# Patient Record
Sex: Male | Born: 1950 | Race: White | Hispanic: No | Marital: Married | Smoking: Never smoker
Health system: Southern US, Community
[De-identification: ages and names within clinical notes are randomized; demographics above are authoritative.]

## PROBLEM LIST (undated history)

## (undated) DIAGNOSIS — I10 Essential (primary) hypertension: Secondary | ICD-10-CM

## (undated) DIAGNOSIS — R972 Elevated prostate specific antigen [PSA]: Secondary | ICD-10-CM

## (undated) DIAGNOSIS — C801 Malignant (primary) neoplasm, unspecified: Secondary | ICD-10-CM

## (undated) DIAGNOSIS — M1990 Unspecified osteoarthritis, unspecified site: Secondary | ICD-10-CM

## (undated) DIAGNOSIS — Z973 Presence of spectacles and contact lenses: Secondary | ICD-10-CM

---

## 1976-07-01 HISTORY — PX: ANAL FISSURE REPAIR: SHX2312

## 2001-11-05 ENCOUNTER — Ambulatory Visit (HOSPITAL_BASED_OUTPATIENT_CLINIC_OR_DEPARTMENT_OTHER): Admission: RE | Admit: 2001-11-05 | Discharge: 2001-11-05 | Payer: Self-pay | Admitting: Orthopedic Surgery

## 2003-06-17 ENCOUNTER — Ambulatory Visit (HOSPITAL_COMMUNITY): Admission: RE | Admit: 2003-06-17 | Discharge: 2003-06-17 | Payer: Self-pay | Admitting: Gastroenterology

## 2012-01-23 ENCOUNTER — Other Ambulatory Visit: Payer: Self-pay | Admitting: Physician Assistant

## 2012-02-04 ENCOUNTER — Other Ambulatory Visit: Payer: Self-pay | Admitting: Dermatology

## 2016-05-01 DIAGNOSIS — L570 Actinic keratosis: Secondary | ICD-10-CM | POA: Diagnosis not present

## 2016-05-01 DIAGNOSIS — Z85828 Personal history of other malignant neoplasm of skin: Secondary | ICD-10-CM | POA: Diagnosis not present

## 2016-05-01 DIAGNOSIS — D225 Melanocytic nevi of trunk: Secondary | ICD-10-CM | POA: Diagnosis not present

## 2016-05-01 DIAGNOSIS — D2271 Melanocytic nevi of right lower limb, including hip: Secondary | ICD-10-CM | POA: Diagnosis not present

## 2016-05-01 DIAGNOSIS — D485 Neoplasm of uncertain behavior of skin: Secondary | ICD-10-CM | POA: Diagnosis not present

## 2016-05-01 DIAGNOSIS — B351 Tinea unguium: Secondary | ICD-10-CM | POA: Diagnosis not present

## 2016-05-01 DIAGNOSIS — L821 Other seborrheic keratosis: Secondary | ICD-10-CM | POA: Diagnosis not present

## 2016-05-01 DIAGNOSIS — D2261 Melanocytic nevi of right upper limb, including shoulder: Secondary | ICD-10-CM | POA: Diagnosis not present

## 2016-05-01 DIAGNOSIS — D2272 Melanocytic nevi of left lower limb, including hip: Secondary | ICD-10-CM | POA: Diagnosis not present

## 2016-05-01 DIAGNOSIS — L814 Other melanin hyperpigmentation: Secondary | ICD-10-CM | POA: Diagnosis not present

## 2016-05-07 DIAGNOSIS — R350 Frequency of micturition: Secondary | ICD-10-CM | POA: Diagnosis not present

## 2016-05-07 DIAGNOSIS — R972 Elevated prostate specific antigen [PSA]: Secondary | ICD-10-CM | POA: Diagnosis not present

## 2016-05-07 DIAGNOSIS — N5201 Erectile dysfunction due to arterial insufficiency: Secondary | ICD-10-CM | POA: Diagnosis not present

## 2016-05-07 DIAGNOSIS — N401 Enlarged prostate with lower urinary tract symptoms: Secondary | ICD-10-CM | POA: Diagnosis not present

## 2016-05-15 ENCOUNTER — Other Ambulatory Visit: Payer: Self-pay | Admitting: Urology

## 2016-05-15 DIAGNOSIS — R972 Elevated prostate specific antigen [PSA]: Secondary | ICD-10-CM

## 2016-06-04 ENCOUNTER — Ambulatory Visit
Admission: RE | Admit: 2016-06-04 | Discharge: 2016-06-04 | Disposition: A | Discharge status: Home / Self Care | Payer: Medicare Other | Source: Ambulatory Visit | Attending: Urology | Admitting: Urology

## 2016-06-04 DIAGNOSIS — N400 Enlarged prostate without lower urinary tract symptoms: Secondary | ICD-10-CM | POA: Diagnosis not present

## 2016-06-04 DIAGNOSIS — R972 Elevated prostate specific antigen [PSA]: Secondary | ICD-10-CM

## 2016-06-04 IMAGING — MR MR PROSTATE WO/W CM
56 series · 56 of 56 positions shown · IV contrast (Multihance 18ml)
Comparison: None.

CLINICAL DATA: Enlarged prostate, rising PSA (now 7.0), prior
benign biopsy in [XR]

EXAM:
MR PROSTATE WITHOUT AND WITH CONTRAST
TECHNIQUE: Multiplanar multisequence MRI images were obtained of the pelvis
centered about the prostate. Pre and post contrast images were
obtained.
CONTRAST:  18mL MULTIHANCE GADOBENATE DIMEGLUMINE 529 MG/ML IV SOLN

[Series 3: T1 · axial · 8.0mm · 1.06mm/px · 1 of 24 slices shown (1 of 2)]
[im 1/24]
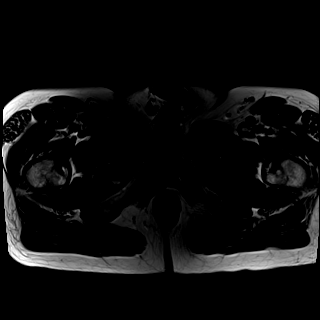

[Series 4: bSSFP fat-sat · axial · 8.0mm · 0.74mm/px · 1 of 24 slices shown]
[im 1/24]
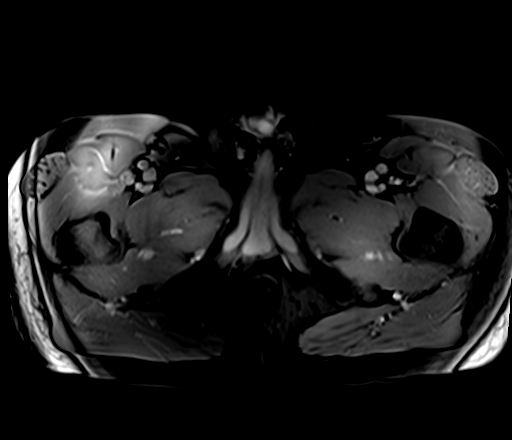

[Series 5: T2 · sagittal · 3.5mm · 0.56mm/px · 1 of 39 slices shown (1 of 4)]
[im 1/39]
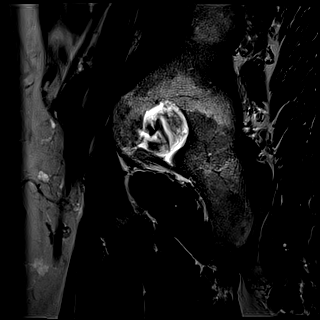

[Series 6: T1 · axial · 3.0mm · 0.31mm/px · 1 of 24 slices shown (2 of 2)]
[im 1/24]
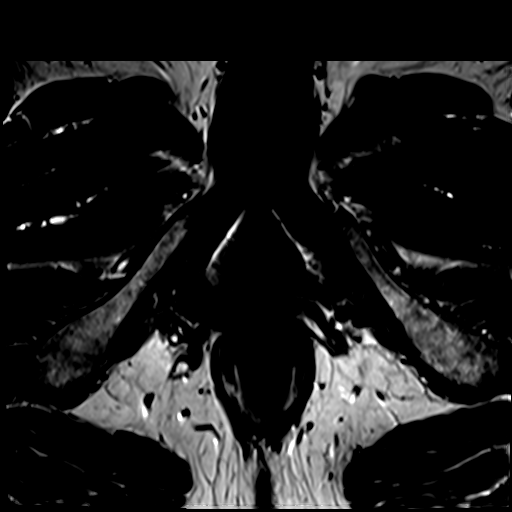

[Series 7: T2 · axial · 3.5mm · 0.56mm/px · 1 of 20 slices shown (2 of 4)]
[im 1/20]
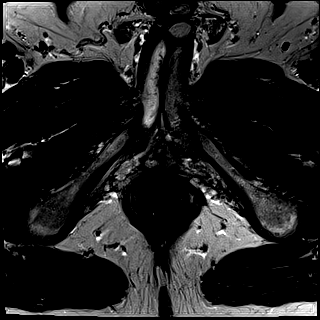

[Series 8: T2 · axial · 1.0mm · 1.04mm/px · 1 of 80 slices shown (3 of 4)]
[im 1/80]
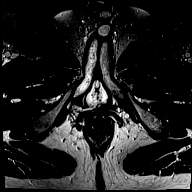

[Series 9: T2 · coronal · 3.5mm · 0.56mm/px · 1 of 20 slices shown (4 of 4)]
[im 1/20]
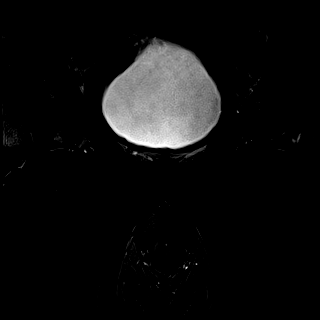

[Series 10: DWI · axial · 3.5mm · 1.56mm/px · 1 of 60 slices shown (1 of 2)]
[im 1/60]
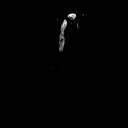

[Series 11: DWI · axial · 3.5mm · 1.56mm/px · 1 of 20 slices shown (2 of 2)]
[im 1/20]
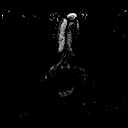

[Series 12: t1_twist_tra_dyn_ttc=5.3s · axial · 3.5mm · 0.83mm/px · 1 of 20 slices shown]
[im 1/20]
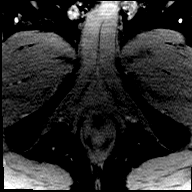

[Series 13: t1_twist_tra_dyn-copy center to · axial · 3.5mm · 0.83mm/px · 1 of 20 slices shown (1 of 24)]
[im 1/20]
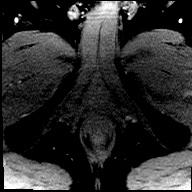

[Series 14: t1_twist_tra_dyn-copy center to · axial · 3.5mm · 0.83mm/px · 1 of 20 slices shown (2 of 24)]
[im 1/20]
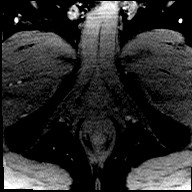

[Series 15: t1_twist_tra_dyn-copy center to_sub_ttc=23.0s · axial · 3.5mm · 0.83mm/px · 1 of 16 slices shown]
[im 1/16]
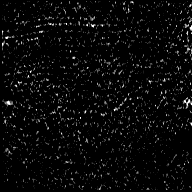

[Series 16: t1_twist_tra_dyn-copy center to · axial · 3.5mm · 0.83mm/px · 1 of 20 slices shown (3 of 24)]
[im 1/20]
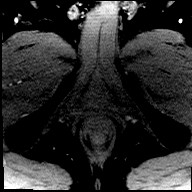

[Series 17: t1_twist_tra_dyn-copy center to_sub_ttc=33.2s · axial · 3.5mm · 0.83mm/px · 1 of 20 slices shown]
[im 1/20]
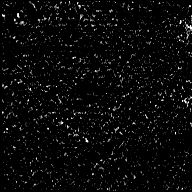

[Series 18: t1_twist_tra_dyn-copy center to · axial · 3.5mm · 0.83mm/px · 1 of 20 slices shown (4 of 24)]
[im 1/20]
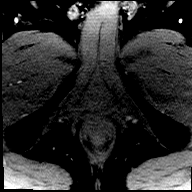

[Series 19: t1_twist_tra_dyn-copy center to_sub_ttc=43.4s · axial · 3.5mm · 0.83mm/px · 1 of 19 slices shown]
[im 1/19]
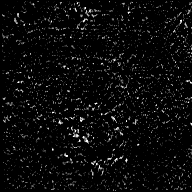

[Series 20: t1_twist_tra_dyn-copy center to · axial · 3.5mm · 0.83mm/px · 1 of 20 slices shown (5 of 24)]
[im 1/20]
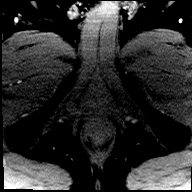

[Series 21: t1_twist_tra_dyn-copy center to_sub_ttc=53.6s · axial · 3.5mm · 0.83mm/px · 1 of 20 slices shown]
[im 1/20]
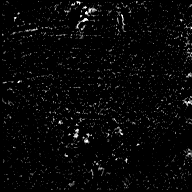

[Series 22: t1_twist_tra_dyn-copy center to · axial · 3.5mm · 0.83mm/px · 1 of 20 slices shown (6 of 24)]
[im 1/20]
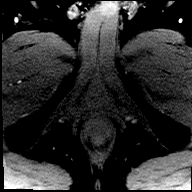

[Series 23: t1_twist_tra_dyn-copy center to_sub_ttc=63.8s · axial · 3.5mm · 0.83mm/px · 1 of 20 slices shown]
[im 1/20]
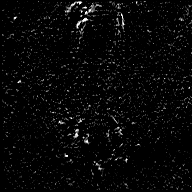

[Series 24: t1_twist_tra_dyn-copy center to · axial · 3.5mm · 0.83mm/px · 1 of 20 slices shown (7 of 24)]
[im 1/20]
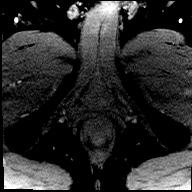

[Series 25: t1_twist_tra_dyn-copy center to_sub_ttc=74.0s · axial · 3.5mm · 0.83mm/px · 1 of 20 slices shown]
[im 1/20]
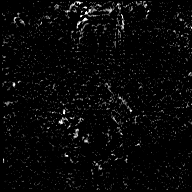

[Series 26: t1_twist_tra_dyn-copy center to · axial · 3.5mm · 0.83mm/px · 1 of 20 slices shown (8 of 24)]
[im 1/20]
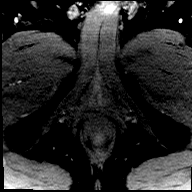

[Series 27: t1_twist_tra_dyn-copy center to_sub_ttc=84.2s · axial · 3.5mm · 0.83mm/px · 1 of 20 slices shown]
[im 1/20]
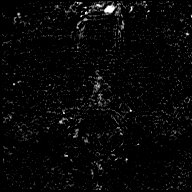

[Series 28: t1_twist_tra_dyn-copy center to · axial · 3.5mm · 0.83mm/px · 1 of 20 slices shown (9 of 24)]
[im 1/20]
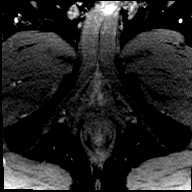

[Series 29: t1_twist_tra_dyn-copy center to_sub_ttc=94.4s · axial · 3.5mm · 0.83mm/px · 1 of 20 slices shown]
[im 1/20]
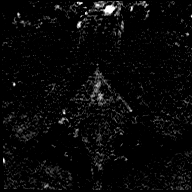

[Series 30: t1_twist_tra_dyn-copy center to · axial · 3.5mm · 0.83mm/px · 1 of 20 slices shown (10 of 24)]
[im 1/20]
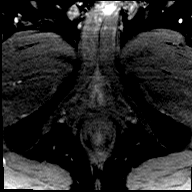

[Series 31: t1_twist_tra_dyn-copy center to_sub_ttc=104.7s · axial · 3.5mm · 0.83mm/px · 1 of 20 slices shown]
[im 1/20]
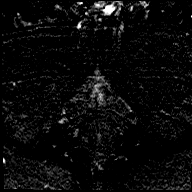

[Series 32: t1_twist_tra_dyn-copy center to · axial · 3.5mm · 0.83mm/px · 1 of 20 slices shown (11 of 24)]
[im 1/20]
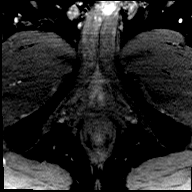

[Series 33: t1_twist_tra_dyn-copy center to_sub_ttc=114.9s · axial · 3.5mm · 0.83mm/px · 1 of 20 slices shown]
[im 1/20]
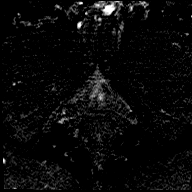

[Series 34: t1_twist_tra_dyn-copy center to · axial · 3.5mm · 0.83mm/px · 1 of 20 slices shown (12 of 24)]
[im 1/20]
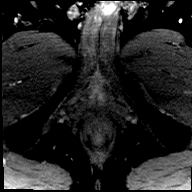

[Series 35: t1_twist_tra_dyn-copy center to_sub_ttc=125.1s · axial · 3.5mm · 0.83mm/px · 1 of 20 slices shown]
[im 1/20]
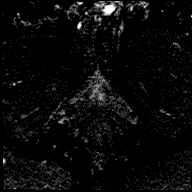

[Series 36: t1_twist_tra_dyn-copy center to · axial · 3.5mm · 0.83mm/px · 1 of 20 slices shown (13 of 24)]
[im 1/20]
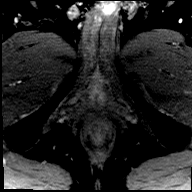

[Series 37: t1_twist_tra_dyn-copy center to_sub_ttc=135.3s · axial · 3.5mm · 0.83mm/px · 1 of 20 slices shown]
[im 1/20]
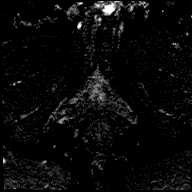

[Series 38: t1_twist_tra_dyn-copy center to · axial · 3.5mm · 0.83mm/px · 1 of 20 slices shown (14 of 24)]
[im 1/20]
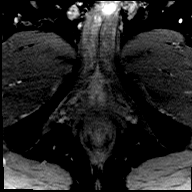

[Series 39: t1_twist_tra_dyn-copy center to_sub_ttc=145.5s · axial · 3.5mm · 0.83mm/px · 1 of 20 slices shown]
[im 1/20]
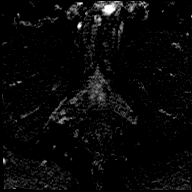

[Series 40: t1_twist_tra_dyn-copy center to · axial · 3.5mm · 0.83mm/px · 1 of 20 slices shown (15 of 24)]
[im 1/20]
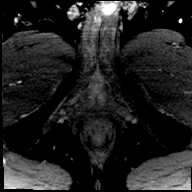

[Series 41: t1_twist_tra_dyn-copy center to_sub_ttc=155.7s · axial · 3.5mm · 0.83mm/px · 1 of 20 slices shown]
[im 1/20]
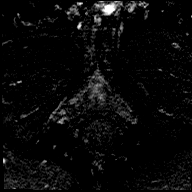

[Series 42: t1_twist_tra_dyn-copy center to · axial · 3.5mm · 0.83mm/px · 1 of 20 slices shown (16 of 24)]
[im 1/20]
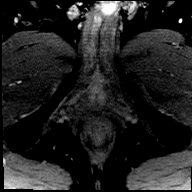

[Series 43: t1_twist_tra_dyn-copy center to_sub_ttc=165.9s · axial · 3.5mm · 0.83mm/px · 1 of 20 slices shown]
[im 1/20]
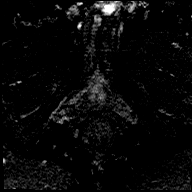

[Series 44: t1_twist_tra_dyn-copy center to · axial · 3.5mm · 0.83mm/px · 1 of 20 slices shown (17 of 24)]
[im 1/20]
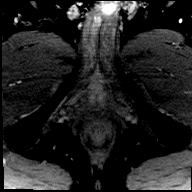

[Series 45: t1_twist_tra_dyn-copy center to_sub_ttc=176.1s · axial · 3.5mm · 0.83mm/px · 1 of 20 slices shown]
[im 1/20]
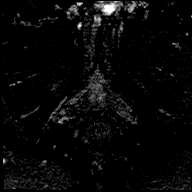

[Series 46: t1_twist_tra_dyn-copy center to · axial · 3.5mm · 0.83mm/px · 1 of 20 slices shown (18 of 24)]
[im 1/20]
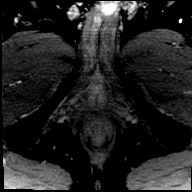

[Series 47: t1_twist_tra_dyn-copy center to_sub_ttc=186.3s · axial · 3.5mm · 0.83mm/px · 1 of 20 slices shown]
[im 1/20]
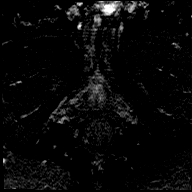

[Series 48: t1_twist_tra_dyn-copy center to · axial · 3.5mm · 0.83mm/px · 1 of 20 slices shown (19 of 24)]
[im 1/20]
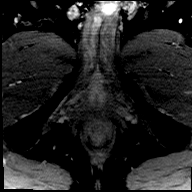

[Series 49: t1_twist_tra_dyn-copy center to_sub_ttc=196.5s · axial · 3.5mm · 0.83mm/px · 1 of 20 slices shown]
[im 1/20]
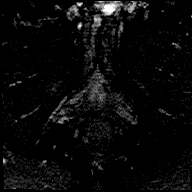

[Series 50: t1_twist_tra_dyn-copy center to · axial · 3.5mm · 0.83mm/px · 1 of 20 slices shown (20 of 24)]
[im 1/20]
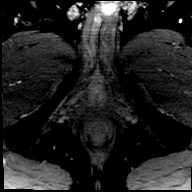

[Series 51: t1_twist_tra_dyn-copy center to_sub_ttc=206.7s · axial · 3.5mm · 0.83mm/px · 1 of 20 slices shown]
[im 1/20]
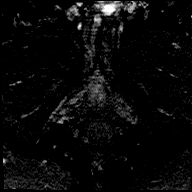

[Series 52: t1_twist_tra_dyn-copy center to · axial · 3.5mm · 0.83mm/px · 1 of 20 slices shown (21 of 24)]
[im 1/20]
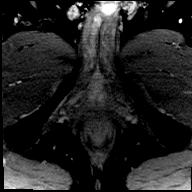

[Series 53: t1_twist_tra_dyn-copy center to_sub_ttc=216.9s · axial · 3.5mm · 0.83mm/px · 1 of 20 slices shown]
[im 1/20]
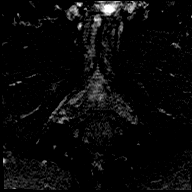

[Series 54: t1_twist_tra_dyn-copy center to · axial · 3.5mm · 0.83mm/px · 1 of 20 slices shown (22 of 24)]
[im 1/20]
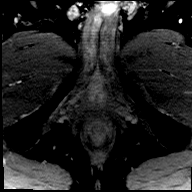

[Series 55: t1_twist_tra_dyn-copy center to_sub_ttc=227.1s · axial · 3.5mm · 0.83mm/px · 1 of 20 slices shown]
[im 1/20]
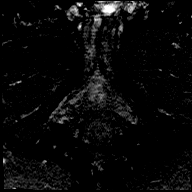

[Series 56: t1_twist_tra_dyn-copy center to · axial · 3.5mm · 0.83mm/px · 1 of 20 slices shown (23 of 24)]
[im 1/20]
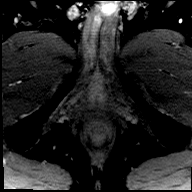

[Series 57: t1_twist_tra_dyn-copy center to_sub_ttc=237.3s · axial · 3.5mm · 0.83mm/px · 1 of 20 slices shown]
[im 1/20]
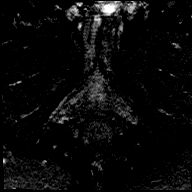

[Series 58: t1_twist_tra_dyn-copy center to · axial · 3.5mm · 0.83mm/px · 1 of 20 slices shown (24 of 24)]
[im 1/20]
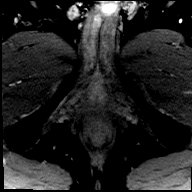

[56 of 56 positions shown; findings below may reference images not displayed]

FINDINGS: Gas within the rectum causes susceptibility artifact on
diffusion/ADC imaging, impairing characterization for restricted
diffusion.

Prostate: 2.1 x 0.9 cm low T2 lesion in the lateral right mid
peripheral gland (series 7/image 11). No definite early arterial
enhancement. Although there is no definite low ADC, this may be
related to the artifact. Technically speaking, the inability to
accurately gauge restricted diffusion inhibits PIRADS
characterization, but this likely reflects a PIRADS 3 lesion based
on T2 appearance.

Mild enlargement/nodularity of the central gland.

Transcapsular spread:  No definite extracapsular extension.

Seminal vesicle involvement: Absent.

Neurovascular bundle involvement: Absent.

Pelvic adenopathy: Absent.

Bone metastasis: Absent.

Other findings: None.
IMPRESSION: Limited evaluation due to artifact, as described above.

2.1 x 0.9 cm low T2 lesion the lateral right mid peripheral gland,
likely reflecting at least a PIRADS 3 lesion.

No evidence of extracapsular extension, seminal vesicle invasion, or
metastatic disease.

## 2016-06-04 MED ORDER — GADOBENATE DIMEGLUMINE 529 MG/ML IV SOLN
18.0000 mL | Freq: Once | INTRAVENOUS | Status: AC | PRN
  Administered 2016-06-04: 18 mL via INTRAVENOUS

## 2016-06-12 DIAGNOSIS — R350 Frequency of micturition: Secondary | ICD-10-CM | POA: Diagnosis not present

## 2016-06-12 DIAGNOSIS — N401 Enlarged prostate with lower urinary tract symptoms: Secondary | ICD-10-CM | POA: Diagnosis not present

## 2016-06-12 DIAGNOSIS — R351 Nocturia: Secondary | ICD-10-CM | POA: Diagnosis not present

## 2016-06-12 DIAGNOSIS — R972 Elevated prostate specific antigen [PSA]: Secondary | ICD-10-CM | POA: Diagnosis not present

## 2016-06-12 DIAGNOSIS — N5201 Erectile dysfunction due to arterial insufficiency: Secondary | ICD-10-CM | POA: Diagnosis not present

## 2016-07-30 ENCOUNTER — Other Ambulatory Visit (HOSPITAL_COMMUNITY): Payer: Self-pay | Admitting: Urology

## 2016-07-30 ENCOUNTER — Other Ambulatory Visit: Payer: Self-pay | Admitting: Urology

## 2016-07-30 DIAGNOSIS — R972 Elevated prostate specific antigen [PSA]: Secondary | ICD-10-CM

## 2016-08-05 ENCOUNTER — Ambulatory Visit (HOSPITAL_COMMUNITY): Payer: Medicare Other

## 2016-08-12 ENCOUNTER — Other Ambulatory Visit: Payer: Self-pay | Admitting: Urology

## 2016-08-15 ENCOUNTER — Encounter (HOSPITAL_BASED_OUTPATIENT_CLINIC_OR_DEPARTMENT_OTHER): Payer: Self-pay | Admitting: *Deleted

## 2016-08-15 NOTE — Progress Notes (Signed)
NPO AFTER MN.  ARRIVE AT 0600.  NEEDS ISTAT 8  AND EKG.  WILL DO FLEET ENEMA AS INSTRUCTED BY OFFICE.

## 2016-08-21 NOTE — H&P (Signed)
Office Visit Report     06/12/2016   --------------------------------------------------------------------------------   Jim Mcknight  MRN: [id number]  PRIMARY CARE:  Cleda Mccreedy  DOB: Sep 07, 1950, 66 year old Male  REFERRING:  Jep Dyas I. Gaynelle Arabian, MD  SSN:  PROVIDER:  Carolan Clines, M.D.    LOCATION:  [facility] [phone]   --------------------------------------------------------------------------------   CC: BPH  HPI: Jim Mcknight is a 66 year-old male established patient who is here for follow up regarding further evaluation of BPH and lower urinary tract symptoms.  The patient complains of lower urinary tract symptom(s) that include frequency and nocturia. His symptoms have been stable over the last year. The patient states if he were to spend the rest of his life with his current urinary condition, he would be pleased.     CC: Elevated PSA-Established Patient  HPI: His last PSA was performed 03/11/2016. The last PSA value was 8.55. The patient states he does not take 5 alpha reductase inhibitor medication.   He has had a prostate biopsy done. Patient does not have a family history of prostate cancer. The patient complains of lower urinary tract symptom(s) that include frequency and nocturia.   He has had a previous prostate biopsy on 02/07/10 which pathology was negative with a 28cc size prostate.   F/u to review MRI prostate.     CC: I have erectile dysfunction (Meds).  HPI: He is currently on Viagra for treatment of his erectile dysfunction.   He does not have difficulties achieving an erection. He does not have trouble maintaining his erection.     AUA Symptom Score: He never has the sensation of not emptying his bladder completely after finishing urinating. Less than 20% of the time he has to urinate again fewer than two hours after he has finished urinating. He does not have to stop and start again several times when he  urinates. He never finds it difficult to postpone urination. He never has a weak urinary stream. He never has to push or strain to begin urination. He has to get up to urinate 2 times from the time he goes to bed until the time he gets up in the morning.   Calculated AUA Symptom Score: 3    ALLERGIES: No Allergies    MEDICATIONS: Lisinopril  Viagra 50 mg tablet  Ecotrin Low Strength 81 MG Oral Tablet Delayed Release Oral  Fish Oil CAPS Oral  Folic Acid [id number] MCG Oral Tablet Oral  Metoprolol Tartrate 25 MG Oral Tablet Oral  Multivitamin  Vitamin C 500 MG Oral Tablet Oral  Vitamin D3 1000 UNIT Oral Capsule Oral     GU PSH: Vasectomy - 2011    NON-GU PSH: Diagnostic Colonoscopy - 2011 Remove Anal Fissure    GU PMH: BPH w/LUTS - 05/07/2016, Benign localized hyperplasia of prostate with urinary obstruction, - 2015 ED, arterial insufficiency - 05/07/2016 Elevated PSA - 05/07/2016, Elevated prostate specific antigen (PSA), - 2015    NON-GU PMH: Encounter for general adult medical examination without abnormal findings, Encounter for preventive health examination - 2015 Diverticulosis, Diverticulosis - 2014 Personal history of other diseases of the circulatory system, History of hypertension - 2014 Personal history of other endocrine, nutritional and metabolic disease, History of hypercholesterolemia - 2014    FAMILY HISTORY: Brain Cancer - Mother Breast Cancer - Mother Family Health Status Number - Runs In Family Father Deceased At [place] ___ - Runs In Family Heart Disease - Brother, Mother Lung Cancer -  Father Mother Deceased At Age 42 from diabetic complicati - Runs In Family   SOCIAL HISTORY: Marital Status: Married Current Smoking Status: Patient has never smoked.  Has never drank.  Drinks 2 caffeinated drinks per day. Patient's occupation Research officer, political party.    REVIEW OF SYSTEMS:    GU Review Male:   Patient reports get up at night to urinate. Patient denies  frequent urination, hard to postpone urination, burning/ pain with urination, leakage of urine, stream starts and stops, trouble starting your stream, have to strain to urinate , erection problems, and penile pain.  Gastrointestinal (Upper):   Patient denies nausea, vomiting, and indigestion/ heartburn.  Gastrointestinal (Lower):   Patient denies diarrhea and constipation.  Constitutional:   Patient denies fever, night sweats, weight loss, and fatigue.  Skin:   Patient denies skin rash/ lesion and itching.  Eyes:   Patient denies blurred vision and double vision.  Ears/ Nose/ Throat:   Patient denies sore throat and sinus problems.  Hematologic/Lymphatic:   Patient denies swollen glands and easy bruising.  Cardiovascular:   Patient denies leg swelling and chest pains.  Respiratory:   Patient denies cough and shortness of breath.  Endocrine:   Patient denies excessive thirst.  Musculoskeletal:   Patient denies back pain and joint pain.  Neurological:   Patient denies headaches and dizziness.  Psychologic:   Patient denies depression and anxiety.   VITAL SIGNS:      06/12/2016 09:09 AM  BP 132/80 mmHg  Pulse 67 /min  Temperature 97.6 F / 36 C   GU PHYSICAL EXAMINATION:    Anus and Perineum: No hemorrhoids. No anal stenosis. No rectal fissure, no anal fissure. No edema, no dimple, no perineal tenderness, no anal tenderness.  Scrotum: No lesions. No edema. No cysts. No warts.  Epididymides: Right: no spermatocele, no masses, no cysts, no tenderness, no induration, no enlargement. Left: no spermatocele, no masses, no cysts, no tenderness, no induration, no enlargement.  Testes: No tenderness, no swelling, no enlargement left testes. No tenderness, no swelling, no enlargement right testes. Normal location left testes. Normal location right testes. No mass, no cyst, no varicocele, no hydrocele left testes. No mass, no cyst, no varicocele, no hydrocele right testes.  Urethral Meatus: Normal size.  No lesion, no wart, no discharge, no polyp. Normal location.  Penis: Circumcised, no warts, no cracks. No dorsal Peyronie's plaques, no left corporal Peyronie's plaques, no right corporal Peyronie's plaques, no scarring, no warts. No balanitis, no meatal stenosis.  Prostate: 40 gram or 2+ size. Left lobe normal consistency, right lobe normal consistency. Symmetrical lobes. No prostate nodule. Left lobe no tenderness, right lobe no tenderness.  Seminal Vesicles: Nonpalpable.  Sphincter Tone: Normal sphincter. No rectal tenderness. No rectal mass.    MULTI-SYSTEM PHYSICAL EXAMINATION:    Constitutional: Well-nourished. No physical deformities. Normally developed. Good grooming.  Neck: Neck symmetrical, not swollen. Normal tracheal position.  Respiratory: No labored breathing, no use of accessory muscles.   Cardiovascular: Normal temperature, normal extremity pulses, no swelling, no varicosities.  Lymphatic: No enlargement of neck, axillae, groin.  Skin: No paleness, no jaundice, no cyanosis. No lesion, no ulcer, no rash.  Neurologic / Psychiatric: Oriented to time, oriented to place, oriented to person. No depression, no anxiety, no agitation.  Gastrointestinal: No mass, no tenderness, no rigidity, non obese abdomen.  Eyes: Normal conjunctivae. Normal eyelids.  Ears, Nose, Mouth, and Throat: Left ear no scars, no lesions, no masses. Right ear no scars, no lesions, no  masses. Nose no scars, no lesions, no masses. Normal hearing. Normal lips.  Musculoskeletal: Normal gait and station of head and neck.     PAST DATA REVIEWED:  Source Of History:  Patient  Records Review:   Previous Patient Records  X-Ray Review: MRI Prostate GSORAD: Reviewed Films. Reviewed Report. Discussed With Patient.     05/07/16 01/16/10  PSA  Total PSA 7.01 ng/dl 4.38   Free PSA 0.85 ng/dl 0.49   % Free PSA 12 % 11.2     PROCEDURES:          Urinalysis Dipstick Dipstick Cont'd  Color: Yellow Bilirubin: Neg   Appearance: Clear Ketones: Neg  Specific Gravity: 1.015 Blood: Neg  pH: 6.5 Protein: Neg  Glucose: Neg Urobilinogen: 0.2    Nitrites: Neg    Leukocyte Esterase: Neg    ASSESSMENT:      ICD-10 Details  1 GU:   BPH w/LUTS - N40.1   2   Elevated PSA - R97.20   3   ED, arterial insufficiency - N52.01           Notes:   66 year old male, who has been followed at the [place] for the last 2 years. His PSA has jumped from 4.8-10 2016 28.55 now.  His repeat PSA is 7.01/12%. 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 He had negative bx in 2011, with significant difficulty with anal stenosis.  He has had MRI at [facility] radiology, [address] which shows a 2.1 x 0.9 cm T2 lesion in the right mid peripheral gland, representing a PIRADS 3 lesion. I have discussed this with the patient and his wife, and have advised him to have a fusion biopsy, which in the office, with double dose Valium premedication. The patient will probably need to have wheelchair access.      PLAN:            Medications Stop Meds: Diazepam 10 mg tablet 1 tablet PO 1 hour prior to procedure  Start: 05/07/2016  Discontinue: 06/04/2016  - Reason: The medication cycle was completed.            Schedule Return Visit: Next Available Appointment - MRI Fusion Bx          Document Letter(s):  Created for Patient: Clinical Summary         Notes:   cc: Dr. Raynelle Bring:        The information contained in this medical record document is considered private and confidential patient information. This information can only be used for the medical diagnosis and/or medical services that are being provided by the patient's selected caregivers. This information can only be distributed outside of the patient's care if the patient agrees and signs waivers of authorization for this information to be sent to an outside source or route.

## 2016-08-22 ENCOUNTER — Ambulatory Visit (HOSPITAL_BASED_OUTPATIENT_CLINIC_OR_DEPARTMENT_OTHER): Payer: Medicare Other | Admitting: Anesthesiology

## 2016-08-22 ENCOUNTER — Ambulatory Visit (HOSPITAL_BASED_OUTPATIENT_CLINIC_OR_DEPARTMENT_OTHER)
Admission: RE | Admit: 2016-08-22 | Discharge: 2016-08-22 | Disposition: A | Discharge status: Home / Self Care | Payer: Medicare Other | Source: Ambulatory Visit | Attending: Urology | Admitting: Urology

## 2016-08-22 ENCOUNTER — Encounter (HOSPITAL_BASED_OUTPATIENT_CLINIC_OR_DEPARTMENT_OTHER): Payer: Self-pay | Admitting: *Deleted

## 2016-08-22 ENCOUNTER — Ambulatory Visit (HOSPITAL_COMMUNITY)
Admission: RE | Admit: 2016-08-22 | Discharge: 2016-08-22 | Disposition: A | Discharge status: Home / Self Care | Payer: Medicare Other | Source: Ambulatory Visit | Attending: Urology | Admitting: Urology

## 2016-08-22 ENCOUNTER — Ambulatory Visit (HOSPITAL_COMMUNITY): Payer: Medicare Other

## 2016-08-22 ENCOUNTER — Encounter (HOSPITAL_BASED_OUTPATIENT_CLINIC_OR_DEPARTMENT_OTHER)
Admission: RE | Disposition: A | Discharge status: Home / Self Care | Payer: Self-pay | Source: Ambulatory Visit | Attending: Urology

## 2016-08-22 DIAGNOSIS — R350 Frequency of micturition: Secondary | ICD-10-CM | POA: Diagnosis not present

## 2016-08-22 DIAGNOSIS — Z79899 Other long term (current) drug therapy: Secondary | ICD-10-CM | POA: Insufficient documentation

## 2016-08-22 DIAGNOSIS — Z808 Family history of malignant neoplasm of other organs or systems: Secondary | ICD-10-CM | POA: Diagnosis not present

## 2016-08-22 DIAGNOSIS — R938 Abnormal findings on diagnostic imaging of other specified body structures: Secondary | ICD-10-CM | POA: Insufficient documentation

## 2016-08-22 DIAGNOSIS — C61 Malignant neoplasm of prostate: Secondary | ICD-10-CM | POA: Diagnosis not present

## 2016-08-22 DIAGNOSIS — K624 Stenosis of anus and rectum: Secondary | ICD-10-CM | POA: Insufficient documentation

## 2016-08-22 DIAGNOSIS — I10 Essential (primary) hypertension: Secondary | ICD-10-CM | POA: Insufficient documentation

## 2016-08-22 DIAGNOSIS — Z833 Family history of diabetes mellitus: Secondary | ICD-10-CM | POA: Insufficient documentation

## 2016-08-22 DIAGNOSIS — R351 Nocturia: Secondary | ICD-10-CM | POA: Diagnosis not present

## 2016-08-22 DIAGNOSIS — N4289 Other specified disorders of prostate: Secondary | ICD-10-CM | POA: Diagnosis not present

## 2016-08-22 DIAGNOSIS — Z7982 Long term (current) use of aspirin: Secondary | ICD-10-CM | POA: Insufficient documentation

## 2016-08-22 DIAGNOSIS — Z8249 Family history of ischemic heart disease and other diseases of the circulatory system: Secondary | ICD-10-CM | POA: Insufficient documentation

## 2016-08-22 DIAGNOSIS — Z803 Family history of malignant neoplasm of breast: Secondary | ICD-10-CM | POA: Insufficient documentation

## 2016-08-22 DIAGNOSIS — N401 Enlarged prostate with lower urinary tract symptoms: Secondary | ICD-10-CM | POA: Diagnosis not present

## 2016-08-22 DIAGNOSIS — Z801 Family history of malignant neoplasm of trachea, bronchus and lung: Secondary | ICD-10-CM | POA: Insufficient documentation

## 2016-08-22 DIAGNOSIS — R972 Elevated prostate specific antigen [PSA]: Secondary | ICD-10-CM

## 2016-08-22 HISTORY — PX: PROSTATE BIOPSY: SHX241

## 2016-08-22 HISTORY — DX: Presence of spectacles and contact lenses: Z97.3

## 2016-08-22 HISTORY — DX: Elevated prostate specific antigen (PSA): R97.20

## 2016-08-22 HISTORY — DX: Essential (primary) hypertension: I10

## 2016-08-22 LAB — POCT I-STAT, CHEM 8
BUN: 11 mg/dL (ref 6–20)
CREATININE: 1.1 mg/dL (ref 0.61–1.24)
Calcium, Ion: 1.16 mmol/L (ref 1.15–1.40)
Chloride: 100 mmol/L — ABNORMAL LOW (ref 101–111)
Glucose, Bld: 86 mg/dL (ref 65–99)
HEMATOCRIT: 40 % (ref 39.0–52.0)
HEMOGLOBIN: 13.6 g/dL (ref 13.0–17.0)
POTASSIUM: 3.4 mmol/L — AB (ref 3.5–5.1)
SODIUM: 141 mmol/L (ref 135–145)
TCO2: 28 mmol/L (ref 0–100)

## 2016-08-22 SURGERY — BIOPSY, PROSTATE, RECTAL APPROACH, WITH US GUIDANCE
Anesthesia: General | Site: Rectum

## 2016-08-22 MED ORDER — HYDROMORPHONE HCL 1 MG/ML IJ SOLN
0.2500 mg | INTRAMUSCULAR | Status: DC | PRN
  Filled 2016-08-22: qty 0.5

## 2016-08-22 MED ORDER — DEXAMETHASONE SODIUM PHOSPHATE 4 MG/ML IJ SOLN
INTRAMUSCULAR | Status: DC | PRN
  Administered 2016-08-22: 10 mg via INTRAVENOUS

## 2016-08-22 MED ORDER — LIDOCAINE HCL 2 % EX GEL
CUTANEOUS | Status: AC
  Filled 2016-08-22: qty 5

## 2016-08-22 MED ORDER — BELLADONNA ALKALOIDS-OPIUM 16.2-60 MG RE SUPP
RECTAL | Status: AC
  Filled 2016-08-22: qty 1

## 2016-08-22 MED ORDER — EPHEDRINE 5 MG/ML INJ
INTRAVENOUS | Status: AC
  Filled 2016-08-22: qty 10

## 2016-08-22 MED ORDER — PHENAZOPYRIDINE HCL 200 MG PO TABS: 200.0000 mg | ORAL_TABLET | Freq: Three times a day (TID) | ORAL | 0 refills | Status: DC | PRN

## 2016-08-22 MED ORDER — ONDANSETRON HCL 4 MG/2ML IJ SOLN
4.0000 mg | Freq: Once | INTRAMUSCULAR | Status: DC | PRN
  Filled 2016-08-22: qty 2

## 2016-08-22 MED ORDER — ACETAMINOPHEN 500 MG PO TABS
ORAL_TABLET | ORAL | Status: AC
  Filled 2016-08-22: qty 2

## 2016-08-22 MED ORDER — PROPOFOL 10 MG/ML IV BOLUS
INTRAVENOUS | Status: DC | PRN
  Administered 2016-08-22: 200 mg via INTRAVENOUS

## 2016-08-22 MED ORDER — ONDANSETRON HCL 4 MG/2ML IJ SOLN
INTRAMUSCULAR | Status: AC
  Filled 2016-08-22: qty 2

## 2016-08-22 MED ORDER — LIDOCAINE 2% (20 MG/ML) 5 ML SYRINGE
INTRAMUSCULAR | Status: DC | PRN
  Administered 2016-08-22: 100 mg via INTRAVENOUS

## 2016-08-22 MED ORDER — DEXAMETHASONE SODIUM PHOSPHATE 10 MG/ML IJ SOLN
INTRAMUSCULAR | Status: AC
  Filled 2016-08-22: qty 1

## 2016-08-22 MED ORDER — LACTATED RINGERS IV SOLN
INTRAVENOUS | Status: DC
  Administered 2016-08-22 (×2): via INTRAVENOUS
  Filled 2016-08-22: qty 1000

## 2016-08-22 MED ORDER — CEFAZOLIN SODIUM-DEXTROSE 2-4 GM/100ML-% IV SOLN
INTRAVENOUS | Status: AC
  Filled 2016-08-22: qty 100

## 2016-08-22 MED ORDER — LIDOCAINE HCL 2 % IJ SOLN
INTRAMUSCULAR | Status: AC
  Filled 2016-08-22: qty 20

## 2016-08-22 MED ORDER — ONDANSETRON HCL 4 MG/2ML IJ SOLN
INTRAMUSCULAR | Status: DC | PRN
  Administered 2016-08-22: 4 mg via INTRAVENOUS

## 2016-08-22 MED ORDER — FENTANYL CITRATE (PF) 100 MCG/2ML IJ SOLN
INTRAMUSCULAR | Status: DC | PRN
  Administered 2016-08-22: 50 ug via INTRAVENOUS

## 2016-08-22 MED ORDER — PROPOFOL 10 MG/ML IV BOLUS
INTRAVENOUS | Status: AC
  Filled 2016-08-22: qty 40

## 2016-08-22 MED ORDER — LIDOCAINE HCL 2 % EX GEL
CUTANEOUS | Status: DC | PRN
  Administered 2016-08-22: 1

## 2016-08-22 MED ORDER — EPHEDRINE SULFATE-NACL 50-0.9 MG/10ML-% IV SOSY
PREFILLED_SYRINGE | INTRAVENOUS | Status: DC | PRN
  Administered 2016-08-22: 10 mg via INTRAVENOUS
  Administered 2016-08-22: 15 mg via INTRAVENOUS
  Administered 2016-08-22: 10 mg via INTRAVENOUS

## 2016-08-22 MED ORDER — ARTIFICIAL TEARS OP OINT
TOPICAL_OINTMENT | OPHTHALMIC | Status: AC
  Filled 2016-08-22: qty 3.5

## 2016-08-22 MED ORDER — LIDOCAINE HCL (CARDIAC) 20 MG/ML IV SOLN: INTRAVENOUS | Status: DC | PRN

## 2016-08-22 MED ORDER — MIDAZOLAM HCL 5 MG/5ML IJ SOLN
INTRAMUSCULAR | Status: DC | PRN
  Administered 2016-08-22: 2 mg via INTRAVENOUS

## 2016-08-22 MED ORDER — MEPERIDINE HCL 25 MG/ML IJ SOLN
6.2500 mg | INTRAMUSCULAR | Status: DC | PRN
  Filled 2016-08-22: qty 1

## 2016-08-22 MED ORDER — FENTANYL CITRATE (PF) 100 MCG/2ML IJ SOLN
INTRAMUSCULAR | Status: AC
Start: 2016-08-22 — End: 2016-08-22
  Filled 2016-08-22: qty 2

## 2016-08-22 MED ORDER — CEFAZOLIN SODIUM-DEXTROSE 2-4 GM/100ML-% IV SOLN
2.0000 g | INTRAVENOUS | Status: AC
  Administered 2016-08-22: 2 g via INTRAVENOUS
  Filled 2016-08-22: qty 100

## 2016-08-22 MED ORDER — KETOROLAC TROMETHAMINE 30 MG/ML IJ SOLN
INTRAMUSCULAR | Status: DC | PRN
  Administered 2016-08-22: 30 mg via INTRAVENOUS

## 2016-08-22 MED ORDER — KETOROLAC TROMETHAMINE 30 MG/ML IJ SOLN
INTRAMUSCULAR | Status: AC
  Filled 2016-08-22: qty 1

## 2016-08-22 MED ORDER — FLEET ENEMA 7-19 GM/118ML RE ENEM
1.0000 | ENEMA | Freq: Once | RECTAL | Status: DC
  Filled 2016-08-22: qty 1

## 2016-08-22 MED ORDER — ACETAMINOPHEN 500 MG PO TABS
1000.0000 mg | ORAL_TABLET | ORAL | Status: AC
  Administered 2016-08-22: 1000 mg via ORAL
  Filled 2016-08-22: qty 2

## 2016-08-22 MED ORDER — MIDAZOLAM HCL 2 MG/2ML IJ SOLN
INTRAMUSCULAR | Status: AC
  Filled 2016-08-22: qty 2

## 2016-08-22 MED ORDER — LIDOCAINE 2% (20 MG/ML) 5 ML SYRINGE
INTRAMUSCULAR | Status: AC
  Filled 2016-08-22: qty 5

## 2016-08-22 SURGICAL SUPPLY — 14 items
DRSG TELFA 3X8 NADH (GAUZE/BANDAGES/DRESSINGS) ×3 IMPLANT
GLOVE BIO SURGEON STRL SZ7.5 (GLOVE) IMPLANT
INST BIOPSY MAXCORE 18GX25 (NEEDLE) ×9 IMPLANT
INSTR BIOPSY MAXCORE 18GX20 (NEEDLE) IMPLANT
KIT RM TURNOVER CYSTO AR (KITS) ×3 IMPLANT
NDL SAFETY ECLIPSE 18X1.5 (NEEDLE) IMPLANT
NEEDLE HYPO 18GX1.5 SHARP (NEEDLE)
NEEDLE SPNL 22GX7 QUINCKE BK (NEEDLE) IMPLANT
SURGILUBE 2OZ TUBE FLIPTOP (MISCELLANEOUS) ×3 IMPLANT
SYR CONTROL 10ML LL (SYRINGE) IMPLANT
TOWEL OR 17X24 6PK STRL BLUE (TOWEL DISPOSABLE) ×3 IMPLANT
TUBE CONNECTING 12'X1/4 (SUCTIONS)
TUBE CONNECTING 12X1/4 (SUCTIONS) IMPLANT
UNDERPAD 30X30 INCONTINENT (UNDERPADS AND DIAPERS) ×3 IMPLANT

## 2016-08-22 NOTE — Op Note (Signed)
Pre-operative diagnosis :  Elevated PSA, abnormal prostate MRI, anal stenosis  Postoperative diagnosis:  Same  Operation: rectal examination under anesthesia, trans-rectal bx Prostate.  Surgeon:  Chauncey Cruel. Gaynelle Arabian, MD  First assistant:  None  Anesthesia:  General LMA  Preparation:  After appropriate premedication, the patient is brought to the operative room, placed on the operating table in dorsal supine position. General LMA anesthesia was introduced. He was then replaced in the left lateral decubitus position, where the armband was double checked. In the history was double checked. The MRI report was printed, and reviewed. The patient received IV antibiotic. Timeout was observed.  History:       66 yo male, ween today with elevated PSA. The patient complains of lower urinary tract symptom(s) that include frequency and nocturia. His symptoms have been stable over the last year. The patient states if he were to spend the rest of his life with his current urinary condition, he would be pleased.       His last PSA was performed 03/11/2016. The last PSA value was 8.55. The patient states he does not take 5 alpha reductase inhibitor medication. He has had a prostate biopsy done. Patient does not have a family history of prostate cancer. The patient complains of lower urinary tract symptom(s) that include frequency and nocturia.   He has had a previous prostate biopsy on 02/07/10 which pathology was negative with a 28cc size prostate.    MRI prostate: Right lateral PI: RADS 3 abnormality.    CC: I have erectile dysfunction (Meds).  HPI: He is currently on Viagra for treatment of his erectile dysfunction.   He does not have difficulties achieving an erection. He does not have trouble maintaining his erection.     AUA Symptom Score: He never has the sensation of not emptying his bladder completely after finishing urinating. Less than 20% of the time he has to urinate again fewer than two hours  after he has finished urinating. He does not have to stop and start again several times when he urinates. He never finds it difficult to postpone urination. He never has a weak urinary stream. He never has to push or strain to begin urination. He has to get up to urinate 2 times from the time he goes to bed until the time he gets up in the morning.   Calculated AUA Symptom Score: 3    ALLERGIES: No Allergies    Review history:    Statement of  Likelihood of Success: Excellent. TIME-OUT observed.:  Procedure: Rectal examination was accomplished, an anal dilation was accomplished. The anus itself was tight, but allowed dilation easily. Xylocaine gel was inserted into the rectum. The ultrasound probe was easily placed in the rectum, and prostate ultrasonography revealed a 35.6cc  prostate by volume.  Using the MRI for direction, multiple cores of tissue were taken from the prostate in 12 separate bottles, with extra cores taken through the area of MRI abnormality on the right side. Minimal bleeding was noted. Following biopsy, pressure was applied to the rectum and the prostate until no bleeding was noted. The patient was replaced in the supine position from the lateral position, and was awakened and taken to recovery room in good condition. He received IV Toradol.

## 2016-08-22 NOTE — Anesthesia Procedure Notes (Signed)
Procedure Name: LMA Insertion Date/Time: 08/22/2016 7:41 AM Performed by: Lillia Abed Pre-anesthesia Checklist: Patient identified, Emergency Drugs available, Suction available and Patient being monitored Patient Re-evaluated:Patient Re-evaluated prior to inductionOxygen Delivery Method: Circle system utilized Preoxygenation: Pre-oxygenation with 100% oxygen Intubation Type: IV induction Ventilation: Mask ventilation without difficulty LMA: LMA inserted LMA Size: 5.0 Number of attempts: 1 Airway Equipment and Method: Bite block Placement Confirmation: positive ETCO2 Tube secured with: Tape Dental Injury: Teeth and Oropharynx as per pre-operative assessment

## 2016-08-22 NOTE — Discharge Instructions (Addendum)
Call your surgeon if you experience:   1.  Fever over 101.0. 2.  Inability to urinate. 3.  Nausea and/or vomiting. 4.  Extreme swelling or bruising at the surgical site. 5.  Continued bleeding from the incision. 6.  Increased pain, redness or drainage from the incision. 7.  Problems related to your pain medication. 8.  Any problems and/or concerns Post Anesthesia Home Care Instructions  Activity: Get plenty of rest for the remainder of the day. A responsible adult should stay with you for 24 hours following the procedure.  For the next 24 hours, DO NOT: -Drive a car -Paediatric nurse -Drink alcoholic beverages -Take any medication unless instructed by your physician -Make any legal decisions or sign important papers.  Meals: Start with liquid foods such as gelatin or soup. Progress to regular foods as tolerated. Avoid greasy, spicy, heavy foods. If nausea and/or vomiting occur, drink only clear liquids until the nausea and/or vomiting subsides. Call your physician if vomiting continues.  Special Instructions/Symptoms: Your throat may feel dry or sore from the anesthesia or the breathing tube placed in your throat during surgery. If this causes discomfort, gargle with warm salt water. The discomfort should disappear within 24 hours.  If you had a scopolamine patch placed behind your ear for the management of post- operative nausea and/or vomiting:  1. The medication in the patch is effective for 72 hours, after which it should be removed.  Wrap patch in a tissue and discard in the trash. Wash hands thoroughly with soap and water. 2. You may remove the patch earlier than 72 hours if you experience unpleasant side effects which may include dry mouth, dizziness or visual disturbances. 3. Avoid touching the patch. Wash your hands with soap and water after contact with the patch.   Transrectal Ultrasound-Guided Biopsy A transrectal ultrasound-guided biopsy is a procedure to take samples  of tissue from your prostate. Ultrasound images are used to guide the procedure. It is usually done to check the prostate gland for cancer. What happens before the procedure?  Do not eat or drink after midnight on the night before your procedure.  Take medicines as your doctor tells you.  Your doctor may have you stop taking some medicines 5-7 days before the procedure.  You will be given an enema before your procedure. During an enema, a liquid is put into your butt (rectum) to clear out waste.  You may have lab tests the day of your procedure.  Make plans to have someone drive you home. What happens during the procedure?  You will be given medicine to help you relax before the procedure. An IV tube will be put into one of your veins. It will be used to give fluids and medicine.  You will be given medicine to reduce the risk of infection (antibiotic).  You will be placed on your side.  A probe with gel will be put in your butt. This is used to take pictures of your prostate and the area around it.  A medicine to numb the area is put into your prostate.  A biopsy needle is then inserted and guided to your prostate.  Samples of prostate tissue are taken. The needle is removed.  The samples are sent to a lab to be checked. Results are usually back in 2-3 days. What happens after the procedure?  You will be taken to a room where you will be watched until you are doing okay.  You may have some pain in  the area around your butt. You will be given medicines for this.  You may be able to go home the same day. Sometimes, an overnight stay in the hospital is needed. This information is not intended to replace advice given to you by your health care provider. Make sure you discuss any questions you have with your health care provider. Document Released: 06/05/2009 Document Revised: 11/23/2015 Document Reviewed: 02/03/2013 Elsevier Interactive Patient Education  2017 [employer].

## 2016-08-22 NOTE — Interval H&P Note (Signed)
History and Physical Interval Note:  08/22/2016 7:05 AM  Jim Mcknight  has presented today for surgery, with the diagnosis of ELEVATED PSA  The various methods of treatment have been discussed with the patient and family. After consideration of risks, benefits and other options for treatment, the patient has consented to  Procedure(s): BIOPSY TRANSRECTAL ULTRASONIC PROSTATE (TUBP) (N/A) as a surgical intervention .  The patient's history has been reviewed, patient examined, no change in status, stable for surgery.  I have reviewed the patient's chart and labs.  Questions were answered to the patient's satisfaction.     Ly Wass I Iraida Cragin

## 2016-08-22 NOTE — Anesthesia Postprocedure Evaluation (Signed)
Anesthesia Post Note  Patient: [place] Crane  Procedure(s) Performed: Procedure(s) (LRB): BIOPSY TRANSRECTAL ULTRASONIC PROSTATE (TUBP) (N/A)  Patient location during evaluation: PACU Anesthesia Type: General Level of consciousness: awake and alert Pain management: pain level controlled Vital Signs Assessment: post-procedure vital signs reviewed and stable Respiratory status: spontaneous breathing, nonlabored ventilation, respiratory function stable and patient connected to nasal cannula oxygen Cardiovascular status: blood pressure returned to baseline and stable Postop Assessment: no signs of nausea or vomiting Anesthetic complications: no       Last Vitals:  Vitals:   08/22/16 0900 08/22/16 0956  BP: (!) 148/82 (!) 156/88  Pulse: 85 81  Resp: 18 18  Temp:  36.6 C    Last Pain:  Vitals:   08/22/16 0956  TempSrc: Axillary                 Zury Fazzino DAVID

## 2016-08-22 NOTE — Anesthesia Preprocedure Evaluation (Signed)
Anesthesia Evaluation  Patient identified by MRN, date of birth, ID band Patient awake    Reviewed: Allergy & Precautions, NPO status , Patient's Chart, lab work & pertinent test results  Airway Mallampati: I  TM Distance: >3 FB Neck ROM: Full    Dental   Pulmonary    Pulmonary exam normal        Cardiovascular hypertension, Pt. on medications Normal cardiovascular exam     Neuro/Psych    GI/Hepatic   Endo/Other    Renal/GU      Musculoskeletal   Abdominal   Peds  Hematology   Anesthesia Other Findings   Reproductive/Obstetrics                             Anesthesia Physical Anesthesia Plan  ASA: II  Anesthesia Plan: General   Post-op Pain Management:    Induction: Intravenous  Airway Management Planned: LMA  Additional Equipment:   Intra-op Plan:   Post-operative Plan: Extubation in OR  Informed Consent: I have reviewed the patients History and Physical, chart, labs and discussed the procedure including the risks, benefits and alternatives for the proposed anesthesia with the patient or authorized representative who has indicated his/her understanding and acceptance.     Plan Discussed with: CRNA and Surgeon  Anesthesia Plan Comments:         Anesthesia Quick Evaluation  

## 2016-08-22 NOTE — Transfer of Care (Signed)
  Last Vitals:  Vitals:   08/22/16 0624  BP: (!) 161/92  Pulse: 74  Resp: 16  Temp: 36.6 C    Last Pain:  Vitals:   08/22/16 0624  TempSrc: Oral      Patients Stated Pain Goal: 6 (08/22/16 [id number])  Immediate Anesthesia Transfer of Care Note  Patient: Jim Mcknight  Procedure(s) Performed: Procedure(s) (LRB): BIOPSY TRANSRECTAL ULTRASONIC PROSTATE (TUBP) (N/A)  Patient Location: PACU  Anesthesia Type: General  Level of Consciousness: awake, alert  and oriented  Airway & Oxygen Therapy: Patient Spontanous Breathing and Patient connected to face mask oxygen  Post-op Assessment: Report given to PACU RN and Post -op Vital signs reviewed and stable  Post vital signs: Reviewed and stable  Complications: No apparent anesthesia complications

## 2016-08-23 ENCOUNTER — Encounter (HOSPITAL_BASED_OUTPATIENT_CLINIC_OR_DEPARTMENT_OTHER): Payer: Self-pay | Admitting: Urology

## 2016-08-29 DIAGNOSIS — C61 Malignant neoplasm of prostate: Secondary | ICD-10-CM | POA: Diagnosis not present

## 2016-09-25 DIAGNOSIS — R972 Elevated prostate specific antigen [PSA]: Secondary | ICD-10-CM | POA: Diagnosis not present

## 2016-09-25 DIAGNOSIS — I10 Essential (primary) hypertension: Secondary | ICD-10-CM | POA: Diagnosis not present

## 2016-09-25 DIAGNOSIS — N529 Male erectile dysfunction, unspecified: Secondary | ICD-10-CM | POA: Diagnosis not present

## 2016-09-30 DIAGNOSIS — C61 Malignant neoplasm of prostate: Secondary | ICD-10-CM | POA: Diagnosis not present

## 2016-11-06 DIAGNOSIS — Z23 Encounter for immunization: Secondary | ICD-10-CM | POA: Diagnosis not present

## 2016-11-06 DIAGNOSIS — E559 Vitamin D deficiency, unspecified: Secondary | ICD-10-CM | POA: Diagnosis not present

## 2016-11-06 DIAGNOSIS — Z0000 Encounter for general adult medical examination without abnormal findings: Secondary | ICD-10-CM | POA: Diagnosis not present

## 2016-11-06 DIAGNOSIS — C61 Malignant neoplasm of prostate: Secondary | ICD-10-CM | POA: Diagnosis not present

## 2016-11-06 DIAGNOSIS — I10 Essential (primary) hypertension: Secondary | ICD-10-CM | POA: Diagnosis not present

## 2016-11-06 DIAGNOSIS — N529 Male erectile dysfunction, unspecified: Secondary | ICD-10-CM | POA: Diagnosis not present

## 2017-01-27 DIAGNOSIS — N5089 Other specified disorders of the male genital organs: Secondary | ICD-10-CM | POA: Diagnosis not present

## 2017-02-03 DIAGNOSIS — C61 Malignant neoplasm of prostate: Secondary | ICD-10-CM | POA: Diagnosis not present

## 2017-02-13 DIAGNOSIS — E7800 Pure hypercholesterolemia, unspecified: Secondary | ICD-10-CM | POA: Diagnosis not present

## 2017-02-24 DIAGNOSIS — C61 Malignant neoplasm of prostate: Secondary | ICD-10-CM | POA: Diagnosis not present

## 2017-02-24 DIAGNOSIS — N503 Cyst of epididymis: Secondary | ICD-10-CM | POA: Diagnosis not present

## 2017-04-09 DIAGNOSIS — C61 Malignant neoplasm of prostate: Secondary | ICD-10-CM | POA: Diagnosis not present

## 2017-04-09 DIAGNOSIS — N451 Epididymitis: Secondary | ICD-10-CM | POA: Diagnosis not present

## 2017-06-02 DIAGNOSIS — C61 Malignant neoplasm of prostate: Secondary | ICD-10-CM | POA: Diagnosis not present

## 2017-07-02 DIAGNOSIS — L821 Other seborrheic keratosis: Secondary | ICD-10-CM | POA: Diagnosis not present

## 2017-07-02 DIAGNOSIS — L570 Actinic keratosis: Secondary | ICD-10-CM | POA: Diagnosis not present

## 2017-07-02 DIAGNOSIS — D2262 Melanocytic nevi of left upper limb, including shoulder: Secondary | ICD-10-CM | POA: Diagnosis not present

## 2017-07-02 DIAGNOSIS — D1801 Hemangioma of skin and subcutaneous tissue: Secondary | ICD-10-CM | POA: Diagnosis not present

## 2017-07-02 DIAGNOSIS — D2271 Melanocytic nevi of right lower limb, including hip: Secondary | ICD-10-CM | POA: Diagnosis not present

## 2017-07-02 DIAGNOSIS — D2261 Melanocytic nevi of right upper limb, including shoulder: Secondary | ICD-10-CM | POA: Diagnosis not present

## 2017-07-02 DIAGNOSIS — D2272 Melanocytic nevi of left lower limb, including hip: Secondary | ICD-10-CM | POA: Diagnosis not present

## 2017-07-02 DIAGNOSIS — Z85828 Personal history of other malignant neoplasm of skin: Secondary | ICD-10-CM | POA: Diagnosis not present

## 2017-07-02 DIAGNOSIS — D225 Melanocytic nevi of trunk: Secondary | ICD-10-CM | POA: Diagnosis not present

## 2017-10-16 DIAGNOSIS — C61 Malignant neoplasm of prostate: Secondary | ICD-10-CM | POA: Diagnosis not present

## 2017-10-22 DIAGNOSIS — C61 Malignant neoplasm of prostate: Secondary | ICD-10-CM | POA: Diagnosis not present

## 2017-11-17 DIAGNOSIS — C61 Malignant neoplasm of prostate: Secondary | ICD-10-CM | POA: Diagnosis not present

## 2017-11-26 DIAGNOSIS — C61 Malignant neoplasm of prostate: Secondary | ICD-10-CM | POA: Diagnosis not present

## 2017-11-26 DIAGNOSIS — I10 Essential (primary) hypertension: Secondary | ICD-10-CM | POA: Diagnosis not present

## 2017-11-26 DIAGNOSIS — Z1159 Encounter for screening for other viral diseases: Secondary | ICD-10-CM | POA: Diagnosis not present

## 2017-11-26 DIAGNOSIS — N529 Male erectile dysfunction, unspecified: Secondary | ICD-10-CM | POA: Diagnosis not present

## 2017-11-26 DIAGNOSIS — E7800 Pure hypercholesterolemia, unspecified: Secondary | ICD-10-CM | POA: Diagnosis not present

## 2017-11-26 DIAGNOSIS — Z0000 Encounter for general adult medical examination without abnormal findings: Secondary | ICD-10-CM | POA: Diagnosis not present

## 2018-05-21 DIAGNOSIS — C61 Malignant neoplasm of prostate: Secondary | ICD-10-CM | POA: Diagnosis not present

## 2018-06-03 DIAGNOSIS — C61 Malignant neoplasm of prostate: Secondary | ICD-10-CM | POA: Diagnosis not present

## 2018-08-13 DIAGNOSIS — D2262 Melanocytic nevi of left upper limb, including shoulder: Secondary | ICD-10-CM | POA: Diagnosis not present

## 2018-08-13 DIAGNOSIS — D2272 Melanocytic nevi of left lower limb, including hip: Secondary | ICD-10-CM | POA: Diagnosis not present

## 2018-08-13 DIAGNOSIS — L821 Other seborrheic keratosis: Secondary | ICD-10-CM | POA: Diagnosis not present

## 2018-08-13 DIAGNOSIS — D044 Carcinoma in situ of skin of scalp and neck: Secondary | ICD-10-CM | POA: Diagnosis not present

## 2018-08-13 DIAGNOSIS — H61001 Unspecified perichondritis of right external ear: Secondary | ICD-10-CM | POA: Diagnosis not present

## 2018-08-13 DIAGNOSIS — D2271 Melanocytic nevi of right lower limb, including hip: Secondary | ICD-10-CM | POA: Diagnosis not present

## 2018-08-13 DIAGNOSIS — B078 Other viral warts: Secondary | ICD-10-CM | POA: Diagnosis not present

## 2018-08-13 DIAGNOSIS — Z85828 Personal history of other malignant neoplasm of skin: Secondary | ICD-10-CM | POA: Diagnosis not present

## 2018-08-13 DIAGNOSIS — D225 Melanocytic nevi of trunk: Secondary | ICD-10-CM | POA: Diagnosis not present

## 2018-08-13 DIAGNOSIS — C4361 Malignant melanoma of right upper limb, including shoulder: Secondary | ICD-10-CM | POA: Diagnosis not present

## 2018-08-13 DIAGNOSIS — D485 Neoplasm of uncertain behavior of skin: Secondary | ICD-10-CM | POA: Diagnosis not present

## 2018-08-13 DIAGNOSIS — D045 Carcinoma in situ of skin of trunk: Secondary | ICD-10-CM | POA: Diagnosis not present

## 2018-08-13 DIAGNOSIS — L570 Actinic keratosis: Secondary | ICD-10-CM | POA: Diagnosis not present

## 2018-09-01 DIAGNOSIS — C4361 Malignant melanoma of right upper limb, including shoulder: Secondary | ICD-10-CM | POA: Diagnosis not present

## 2018-09-01 DIAGNOSIS — Z85828 Personal history of other malignant neoplasm of skin: Secondary | ICD-10-CM | POA: Diagnosis not present

## 2018-09-01 DIAGNOSIS — D0361 Melanoma in situ of right upper limb, including shoulder: Secondary | ICD-10-CM | POA: Diagnosis not present

## 2018-09-01 DIAGNOSIS — Z85820 Personal history of malignant melanoma of skin: Secondary | ICD-10-CM | POA: Diagnosis not present

## 2018-11-11 DIAGNOSIS — D225 Melanocytic nevi of trunk: Secondary | ICD-10-CM | POA: Diagnosis not present

## 2018-11-11 DIAGNOSIS — L570 Actinic keratosis: Secondary | ICD-10-CM | POA: Diagnosis not present

## 2018-11-11 DIAGNOSIS — D2271 Melanocytic nevi of right lower limb, including hip: Secondary | ICD-10-CM | POA: Diagnosis not present

## 2018-11-11 DIAGNOSIS — D2272 Melanocytic nevi of left lower limb, including hip: Secondary | ICD-10-CM | POA: Diagnosis not present

## 2018-11-11 DIAGNOSIS — L821 Other seborrheic keratosis: Secondary | ICD-10-CM | POA: Diagnosis not present

## 2018-11-11 DIAGNOSIS — D2262 Melanocytic nevi of left upper limb, including shoulder: Secondary | ICD-10-CM | POA: Diagnosis not present

## 2018-11-11 DIAGNOSIS — Z85828 Personal history of other malignant neoplasm of skin: Secondary | ICD-10-CM | POA: Diagnosis not present

## 2018-11-11 DIAGNOSIS — Z85820 Personal history of malignant melanoma of skin: Secondary | ICD-10-CM | POA: Diagnosis not present

## 2018-11-27 DIAGNOSIS — Z23 Encounter for immunization: Secondary | ICD-10-CM | POA: Diagnosis not present

## 2018-11-27 DIAGNOSIS — Z0000 Encounter for general adult medical examination without abnormal findings: Secondary | ICD-10-CM | POA: Diagnosis not present

## 2018-11-27 DIAGNOSIS — E559 Vitamin D deficiency, unspecified: Secondary | ICD-10-CM | POA: Diagnosis not present

## 2018-11-27 DIAGNOSIS — Z6825 Body mass index (BMI) 25.0-25.9, adult: Secondary | ICD-10-CM | POA: Diagnosis not present

## 2018-11-27 DIAGNOSIS — I10 Essential (primary) hypertension: Secondary | ICD-10-CM | POA: Diagnosis not present

## 2018-11-27 DIAGNOSIS — K5730 Diverticulosis of large intestine without perforation or abscess without bleeding: Secondary | ICD-10-CM | POA: Diagnosis not present

## 2018-11-27 DIAGNOSIS — Z86010 Personal history of colonic polyps: Secondary | ICD-10-CM | POA: Diagnosis not present

## 2018-11-27 DIAGNOSIS — E7800 Pure hypercholesterolemia, unspecified: Secondary | ICD-10-CM | POA: Diagnosis not present

## 2018-12-01 DIAGNOSIS — E7800 Pure hypercholesterolemia, unspecified: Secondary | ICD-10-CM | POA: Diagnosis not present

## 2018-12-01 DIAGNOSIS — I10 Essential (primary) hypertension: Secondary | ICD-10-CM | POA: Diagnosis not present

## 2018-12-01 DIAGNOSIS — N529 Male erectile dysfunction, unspecified: Secondary | ICD-10-CM | POA: Diagnosis not present

## 2018-12-04 DIAGNOSIS — C61 Malignant neoplasm of prostate: Secondary | ICD-10-CM | POA: Diagnosis not present

## 2018-12-11 DIAGNOSIS — C61 Malignant neoplasm of prostate: Secondary | ICD-10-CM | POA: Diagnosis not present

## 2019-02-17 DIAGNOSIS — Z85820 Personal history of malignant melanoma of skin: Secondary | ICD-10-CM | POA: Diagnosis not present

## 2019-02-17 DIAGNOSIS — D1801 Hemangioma of skin and subcutaneous tissue: Secondary | ICD-10-CM | POA: Diagnosis not present

## 2019-02-17 DIAGNOSIS — D2272 Melanocytic nevi of left lower limb, including hip: Secondary | ICD-10-CM | POA: Diagnosis not present

## 2019-02-17 DIAGNOSIS — D2271 Melanocytic nevi of right lower limb, including hip: Secondary | ICD-10-CM | POA: Diagnosis not present

## 2019-02-17 DIAGNOSIS — Z85828 Personal history of other malignant neoplasm of skin: Secondary | ICD-10-CM | POA: Diagnosis not present

## 2019-02-17 DIAGNOSIS — L814 Other melanin hyperpigmentation: Secondary | ICD-10-CM | POA: Diagnosis not present

## 2019-02-17 DIAGNOSIS — D225 Melanocytic nevi of trunk: Secondary | ICD-10-CM | POA: Diagnosis not present

## 2019-02-17 DIAGNOSIS — L821 Other seborrheic keratosis: Secondary | ICD-10-CM | POA: Diagnosis not present

## 2019-02-17 DIAGNOSIS — L570 Actinic keratosis: Secondary | ICD-10-CM | POA: Diagnosis not present

## 2019-04-20 DIAGNOSIS — Z23 Encounter for immunization: Secondary | ICD-10-CM | POA: Diagnosis not present

## 2019-05-19 DIAGNOSIS — L821 Other seborrheic keratosis: Secondary | ICD-10-CM | POA: Diagnosis not present

## 2019-05-19 DIAGNOSIS — D1801 Hemangioma of skin and subcutaneous tissue: Secondary | ICD-10-CM | POA: Diagnosis not present

## 2019-05-19 DIAGNOSIS — Z85820 Personal history of malignant melanoma of skin: Secondary | ICD-10-CM | POA: Diagnosis not present

## 2019-05-19 DIAGNOSIS — D2272 Melanocytic nevi of left lower limb, including hip: Secondary | ICD-10-CM | POA: Diagnosis not present

## 2019-05-19 DIAGNOSIS — D225 Melanocytic nevi of trunk: Secondary | ICD-10-CM | POA: Diagnosis not present

## 2019-05-19 DIAGNOSIS — D485 Neoplasm of uncertain behavior of skin: Secondary | ICD-10-CM | POA: Diagnosis not present

## 2019-05-19 DIAGNOSIS — L570 Actinic keratosis: Secondary | ICD-10-CM | POA: Diagnosis not present

## 2019-05-19 DIAGNOSIS — D2271 Melanocytic nevi of right lower limb, including hip: Secondary | ICD-10-CM | POA: Diagnosis not present

## 2019-05-19 DIAGNOSIS — C4442 Squamous cell carcinoma of skin of scalp and neck: Secondary | ICD-10-CM | POA: Diagnosis not present

## 2019-05-19 DIAGNOSIS — Z85828 Personal history of other malignant neoplasm of skin: Secondary | ICD-10-CM | POA: Diagnosis not present

## 2019-05-20 DIAGNOSIS — E7800 Pure hypercholesterolemia, unspecified: Secondary | ICD-10-CM | POA: Diagnosis not present

## 2019-05-20 DIAGNOSIS — C61 Malignant neoplasm of prostate: Secondary | ICD-10-CM | POA: Diagnosis not present

## 2019-05-20 DIAGNOSIS — I10 Essential (primary) hypertension: Secondary | ICD-10-CM | POA: Diagnosis not present

## 2019-05-20 DIAGNOSIS — N400 Enlarged prostate without lower urinary tract symptoms: Secondary | ICD-10-CM | POA: Diagnosis not present

## 2019-06-01 DIAGNOSIS — Z1159 Encounter for screening for other viral diseases: Secondary | ICD-10-CM | POA: Diagnosis not present

## 2019-06-04 DIAGNOSIS — Z86010 Personal history of colonic polyps: Secondary | ICD-10-CM | POA: Diagnosis not present

## 2019-06-04 DIAGNOSIS — K640 First degree hemorrhoids: Secondary | ICD-10-CM | POA: Diagnosis not present

## 2019-06-04 DIAGNOSIS — K5730 Diverticulosis of large intestine without perforation or abscess without bleeding: Secondary | ICD-10-CM | POA: Diagnosis not present

## 2019-06-11 DIAGNOSIS — C61 Malignant neoplasm of prostate: Secondary | ICD-10-CM | POA: Diagnosis not present

## 2019-08-03 DIAGNOSIS — I10 Essential (primary) hypertension: Secondary | ICD-10-CM | POA: Diagnosis not present

## 2019-08-03 DIAGNOSIS — E7800 Pure hypercholesterolemia, unspecified: Secondary | ICD-10-CM | POA: Diagnosis not present

## 2019-08-03 DIAGNOSIS — C61 Malignant neoplasm of prostate: Secondary | ICD-10-CM | POA: Diagnosis not present

## 2019-08-03 DIAGNOSIS — N400 Enlarged prostate without lower urinary tract symptoms: Secondary | ICD-10-CM | POA: Diagnosis not present

## 2019-08-18 DIAGNOSIS — L570 Actinic keratosis: Secondary | ICD-10-CM | POA: Diagnosis not present

## 2019-08-18 DIAGNOSIS — D2272 Melanocytic nevi of left lower limb, including hip: Secondary | ICD-10-CM | POA: Diagnosis not present

## 2019-08-18 DIAGNOSIS — L821 Other seborrheic keratosis: Secondary | ICD-10-CM | POA: Diagnosis not present

## 2019-08-18 DIAGNOSIS — D2262 Melanocytic nevi of left upper limb, including shoulder: Secondary | ICD-10-CM | POA: Diagnosis not present

## 2019-08-18 DIAGNOSIS — D485 Neoplasm of uncertain behavior of skin: Secondary | ICD-10-CM | POA: Diagnosis not present

## 2019-08-18 DIAGNOSIS — D2261 Melanocytic nevi of right upper limb, including shoulder: Secondary | ICD-10-CM | POA: Diagnosis not present

## 2019-08-18 DIAGNOSIS — D2271 Melanocytic nevi of right lower limb, including hip: Secondary | ICD-10-CM | POA: Diagnosis not present

## 2019-08-18 DIAGNOSIS — D225 Melanocytic nevi of trunk: Secondary | ICD-10-CM | POA: Diagnosis not present

## 2019-08-18 DIAGNOSIS — Z85828 Personal history of other malignant neoplasm of skin: Secondary | ICD-10-CM | POA: Diagnosis not present

## 2019-08-18 DIAGNOSIS — C44529 Squamous cell carcinoma of skin of other part of trunk: Secondary | ICD-10-CM | POA: Diagnosis not present

## 2019-08-18 DIAGNOSIS — C44329 Squamous cell carcinoma of skin of other parts of face: Secondary | ICD-10-CM | POA: Diagnosis not present

## 2019-08-18 DIAGNOSIS — Z85820 Personal history of malignant melanoma of skin: Secondary | ICD-10-CM | POA: Diagnosis not present

## 2019-09-02 DIAGNOSIS — Z85820 Personal history of malignant melanoma of skin: Secondary | ICD-10-CM | POA: Diagnosis not present

## 2019-09-02 DIAGNOSIS — C44329 Squamous cell carcinoma of skin of other parts of face: Secondary | ICD-10-CM | POA: Diagnosis not present

## 2019-09-02 DIAGNOSIS — Z85828 Personal history of other malignant neoplasm of skin: Secondary | ICD-10-CM | POA: Diagnosis not present

## 2019-10-12 DIAGNOSIS — I10 Essential (primary) hypertension: Secondary | ICD-10-CM | POA: Diagnosis not present

## 2019-10-12 DIAGNOSIS — C61 Malignant neoplasm of prostate: Secondary | ICD-10-CM | POA: Diagnosis not present

## 2019-10-12 DIAGNOSIS — N400 Enlarged prostate without lower urinary tract symptoms: Secondary | ICD-10-CM | POA: Diagnosis not present

## 2019-10-12 DIAGNOSIS — E7800 Pure hypercholesterolemia, unspecified: Secondary | ICD-10-CM | POA: Diagnosis not present

## 2019-10-13 DIAGNOSIS — H2513 Age-related nuclear cataract, bilateral: Secondary | ICD-10-CM | POA: Diagnosis not present

## 2019-11-23 DIAGNOSIS — Z85820 Personal history of malignant melanoma of skin: Secondary | ICD-10-CM | POA: Diagnosis not present

## 2019-11-23 DIAGNOSIS — L570 Actinic keratosis: Secondary | ICD-10-CM | POA: Diagnosis not present

## 2019-11-23 DIAGNOSIS — D2271 Melanocytic nevi of right lower limb, including hip: Secondary | ICD-10-CM | POA: Diagnosis not present

## 2019-11-23 DIAGNOSIS — L814 Other melanin hyperpigmentation: Secondary | ICD-10-CM | POA: Diagnosis not present

## 2019-11-23 DIAGNOSIS — D2262 Melanocytic nevi of left upper limb, including shoulder: Secondary | ICD-10-CM | POA: Diagnosis not present

## 2019-11-23 DIAGNOSIS — Z85828 Personal history of other malignant neoplasm of skin: Secondary | ICD-10-CM | POA: Diagnosis not present

## 2019-11-23 DIAGNOSIS — D2272 Melanocytic nevi of left lower limb, including hip: Secondary | ICD-10-CM | POA: Diagnosis not present

## 2019-11-23 DIAGNOSIS — D225 Melanocytic nevi of trunk: Secondary | ICD-10-CM | POA: Diagnosis not present

## 2019-11-23 DIAGNOSIS — L821 Other seborrheic keratosis: Secondary | ICD-10-CM | POA: Diagnosis not present

## 2019-12-01 DIAGNOSIS — Z0000 Encounter for general adult medical examination without abnormal findings: Secondary | ICD-10-CM | POA: Diagnosis not present

## 2019-12-01 DIAGNOSIS — E7800 Pure hypercholesterolemia, unspecified: Secondary | ICD-10-CM | POA: Diagnosis not present

## 2019-12-01 DIAGNOSIS — E559 Vitamin D deficiency, unspecified: Secondary | ICD-10-CM | POA: Diagnosis not present

## 2019-12-01 DIAGNOSIS — I10 Essential (primary) hypertension: Secondary | ICD-10-CM | POA: Diagnosis not present

## 2019-12-08 DIAGNOSIS — C61 Malignant neoplasm of prostate: Secondary | ICD-10-CM | POA: Diagnosis not present

## 2019-12-08 DIAGNOSIS — E559 Vitamin D deficiency, unspecified: Secondary | ICD-10-CM | POA: Diagnosis not present

## 2019-12-08 DIAGNOSIS — N529 Male erectile dysfunction, unspecified: Secondary | ICD-10-CM | POA: Diagnosis not present

## 2019-12-08 DIAGNOSIS — I10 Essential (primary) hypertension: Secondary | ICD-10-CM | POA: Diagnosis not present

## 2019-12-08 DIAGNOSIS — E7800 Pure hypercholesterolemia, unspecified: Secondary | ICD-10-CM | POA: Diagnosis not present

## 2019-12-08 DIAGNOSIS — Z6826 Body mass index (BMI) 26.0-26.9, adult: Secondary | ICD-10-CM | POA: Diagnosis not present

## 2019-12-08 DIAGNOSIS — Z85820 Personal history of malignant melanoma of skin: Secondary | ICD-10-CM | POA: Diagnosis not present

## 2019-12-13 DIAGNOSIS — C61 Malignant neoplasm of prostate: Secondary | ICD-10-CM | POA: Diagnosis not present

## 2019-12-13 DIAGNOSIS — E7800 Pure hypercholesterolemia, unspecified: Secondary | ICD-10-CM | POA: Diagnosis not present

## 2019-12-13 DIAGNOSIS — I10 Essential (primary) hypertension: Secondary | ICD-10-CM | POA: Diagnosis not present

## 2019-12-13 DIAGNOSIS — N400 Enlarged prostate without lower urinary tract symptoms: Secondary | ICD-10-CM | POA: Diagnosis not present

## 2019-12-15 DIAGNOSIS — C61 Malignant neoplasm of prostate: Secondary | ICD-10-CM | POA: Diagnosis not present

## 2019-12-24 ENCOUNTER — Other Ambulatory Visit: Payer: Self-pay | Admitting: Urology

## 2019-12-24 ENCOUNTER — Other Ambulatory Visit (HOSPITAL_COMMUNITY): Payer: Self-pay | Admitting: Urology

## 2019-12-24 DIAGNOSIS — C61 Malignant neoplasm of prostate: Secondary | ICD-10-CM

## 2020-01-06 DIAGNOSIS — N400 Enlarged prostate without lower urinary tract symptoms: Secondary | ICD-10-CM | POA: Diagnosis not present

## 2020-01-06 DIAGNOSIS — E7800 Pure hypercholesterolemia, unspecified: Secondary | ICD-10-CM | POA: Diagnosis not present

## 2020-01-06 DIAGNOSIS — I10 Essential (primary) hypertension: Secondary | ICD-10-CM | POA: Diagnosis not present

## 2020-01-06 DIAGNOSIS — C61 Malignant neoplasm of prostate: Secondary | ICD-10-CM | POA: Diagnosis not present

## 2020-01-06 NOTE — Patient Instructions (Signed)
DUE TO COVID-19 ONLY ONE VISITOR IS ALLOWED TO COME WITH YOU AND STAY IN THE WAITING ROOM ONLY DURING PRE OP AND PROCEDURE DAY OF SURGERY. TWO VISITOR MAY VISIT WITH YOU AFTER SURGERY IN YOUR PRIVATE ROOM DURING VISITING HOURS ONLY!  YOU NEED TO HAVE A COVID 19 TEST ON_7-26-21______ @_______ , THIS TEST MUST BE DONE BEFORE SURGERY, COME  [place] , [address].  ([place]) ONCE YOUR COVID TEST IS COMPLETED, PLEASE BEGIN THE QUARANTINE INSTRUCTIONS AS OUTLINED IN YOUR HANDOUT.                Jim Mcknight  01/06/2020   Your procedure is scheduled on: 01-27-20   Report to [facility] Main  Entrance   Report to Short stay  at      0530   AM     Call this number if you have problems the morning of surgery [phone]    Remember: Do not eat food:After Midnight. You may have clear liquids until 0430 am then nothing by mouth                                     One fleets enema 2-3 hours prior to arrival to surgery   [place], NO CHEWING GUM [place].     Take these medicines the morning of surgery with A SIP OF WATER: metoprolol, atorvastatin                                 You may not have any metal on your body including hair pins and              piercings  Do not wear jewelry,lotions, powders or perfumes, deodorant                      Men may shave face and neck.   Do not bring valuables to the hospital. [place].  Contacts, dentures or bridgework may not be worn into surgery.      Patients discharged the day of surgery will not be allowed to drive home. IF YOU ARE HAVING SURGERY AND GOING HOME THE SAME DAY, YOU MUST HAVE AN ADULT TO DRIVE YOU HOME AND BE WITH YOU FOR 24 HOURS. YOU MAY GO HOME BY TAXI OR UBER OR ORTHERWISE, BUT AN ADULT MUST ACCOMPANY YOU HOME AND STAY WITH YOU FOR 24 HOURS.  Name and phone number of your  driver:  Special Instructions: N/A              Please read over the following fact sheets you were given: _____________________________________________________________________             [facility] - Preparing for Surgery Before surgery, you can play an important role.  Because skin is not sterile, your skin needs to be as free of germs as possible.  You can reduce the number of germs on your skin by washing with CHG (chlorahexidine gluconate) soap before surgery.  CHG is an antiseptic cleaner which kills germs and bonds with the skin to continue killing germs even after washing. Please DO NOT use if you have an allergy  to CHG or antibacterial soaps.  If your skin becomes reddened/irritated stop using the CHG and inform your nurse when you arrive at Short Stay. Do not shave (including legs and underarms) for at least 48 hours prior to the first CHG shower.  You may shave your face/neck. Please follow these instructions carefully:  1.  Shower with CHG Soap the night before surgery and the  morning of Surgery.  2.  If you choose to wash your hair, wash your hair first as usual with your  normal  shampoo.  3.  After you shampoo, rinse your hair and body thoroughly to remove the  shampoo.                           4.  Use CHG as you would any other liquid soap.  You can apply chg directly  to the skin and wash                       Gently with a scrungie or clean washcloth.  5.  Apply the CHG Soap to your body ONLY FROM THE NECK DOWN.   Do not use on face/ open                           Wound or open sores. Avoid contact with eyes, ears mouth and genitals (private parts).                       Wash face,  Genitals (private parts) with your normal soap.             6.  Wash thoroughly, paying special attention to the area where your surgery  will be performed.  7.  Thoroughly rinse your body with warm water from the neck down.  8.  DO NOT shower/wash with your normal soap after using and rinsing  off  the CHG Soap.                9.  Pat yourself dry with a clean towel.            10.  Wear clean pajamas.            11.  Place clean sheets on your bed the night of your first shower and do not  sleep with pets. Day of Surgery : Do not apply any lotions/deodorants the morning of surgery.  Please wear clean clothes to the hospital/surgery center.  FAILURE TO FOLLOW THESE INSTRUCTIONS MAY RESULT IN THE CANCELLATION OF YOUR SURGERY PATIENT SIGNATURE_________________________________  NURSE SIGNATURE__________________________________  ________________________________________________________________________

## 2020-01-06 NOTE — Progress Notes (Signed)
PCP - Hollace Hayward physcian Cardiologist -   PPM/ICD -  Device Orders -  Rep Notified -   Chest x-ray -  EKG -  Stress Test -  ECHO -  Cardiac Cath -   Sleep Study -  CPAP -   Fasting Blood Sugar -  Checks Blood Sugar _____ times a day  Blood Thinner Instructions: Aspirin Instructions:81 mg stop 7-10 days prior  ERAS Protcol - PRE-SURGERY Ensure or G2-   COVID TEST-   Activity- Pt able to walk a flight of stairs without SOB Anesthesia review:   Patient denies shortness of breath, fever, cough and chest pain at PAT appointment  none   All instructions explained to the patient, with a verbal understanding of the material. Patient agrees to go over the instructions while at home for a better understanding. Patient also instructed to self quarantine after being tested for COVID-19. The opportunity to ask questions was provided.

## 2020-01-13 ENCOUNTER — Other Ambulatory Visit: Payer: Self-pay

## 2020-01-13 ENCOUNTER — Encounter (INDEPENDENT_AMBULATORY_CARE_PROVIDER_SITE_OTHER): Payer: Self-pay

## 2020-01-13 ENCOUNTER — Encounter (HOSPITAL_COMMUNITY)
Admission: RE | Admit: 2020-01-13 | Discharge: 2020-01-13 | Disposition: A | Discharge status: Home / Self Care | Payer: Medicare Other | Source: Ambulatory Visit | Attending: Urology | Admitting: Urology

## 2020-01-13 ENCOUNTER — Encounter (HOSPITAL_COMMUNITY): Payer: Self-pay

## 2020-01-13 DIAGNOSIS — Z01818 Encounter for other preprocedural examination: Secondary | ICD-10-CM | POA: Diagnosis not present

## 2020-01-13 LAB — CBC
HCT: 42.2 % (ref 39.0–52.0)
Hemoglobin: 13.5 g/dL (ref 13.0–17.0)
MCH: 27.1 pg (ref 26.0–34.0)
MCHC: 32 g/dL (ref 30.0–36.0)
MCV: 84.7 fL (ref 80.0–100.0)
Platelets: 326 10*3/uL (ref 150–400)
RBC: 4.98 MIL/uL (ref 4.22–5.81)
RDW: 15 % (ref 11.5–15.5)
WBC: 8.1 10*3/uL (ref 4.0–10.5)
nRBC: 0 % (ref 0.0–0.2)

## 2020-01-13 LAB — BASIC METABOLIC PANEL
Anion gap: 8 (ref 5–15)
BUN: 14 mg/dL (ref 8–23)
CO2: 27 mmol/L (ref 22–32)
Calcium: 8.9 mg/dL (ref 8.9–10.3)
Chloride: 102 mmol/L (ref 98–111)
Creatinine, Ser: 1.03 mg/dL (ref 0.61–1.24)
GFR calc Af Amer: >60 mL/min (ref >60)
GFR calc non Af Amer: >60 mL/min (ref >60)
Glucose, Bld: 81 mg/dL (ref 70–99)
Potassium: 3.6 mmol/L (ref 3.5–5.1)
Sodium: 137 mmol/L (ref 135–145)

## 2020-01-24 ENCOUNTER — Other Ambulatory Visit (HOSPITAL_COMMUNITY)
Admission: RE | Admit: 2020-01-24 | Discharge: 2020-01-24 | Disposition: A | Discharge status: Home / Self Care | Payer: Medicare Other | Source: Ambulatory Visit | Attending: Urology | Admitting: Urology

## 2020-01-24 DIAGNOSIS — Z20822 Contact with and (suspected) exposure to covid-19: Secondary | ICD-10-CM | POA: Diagnosis not present

## 2020-01-24 DIAGNOSIS — Z01812 Encounter for preprocedural laboratory examination: Secondary | ICD-10-CM | POA: Diagnosis not present

## 2020-01-24 LAB — SARS CORONAVIRUS 2 (TAT 6-24 HRS): SARS Coronavirus 2: NEGATIVE

## 2020-01-26 NOTE — Anesthesia Preprocedure Evaluation (Addendum)
Anesthesia Evaluation  Patient identified by MRN, date of birth, ID band Patient awake    Reviewed: Allergy & Precautions, NPO status , Patient's Chart, lab work & pertinent test results, reviewed documented beta blocker date and time   Airway Mallampati: II  TM Distance: >3 FB Neck ROM: Full    Dental no notable dental hx.    Pulmonary neg pulmonary ROS,    Pulmonary exam normal breath sounds clear to auscultation       Cardiovascular hypertension, Pt. on medications and Pt. on home beta blockers Normal cardiovascular exam Rhythm:Regular Rate:Normal  ECG: SB, rate 59   Neuro/Psych negative neurological ROS  negative psych ROS   GI/Hepatic negative GI ROS, Neg liver ROS,   Endo/Other  negative endocrine ROS  Renal/GU negative Renal ROS     Musculoskeletal negative musculoskeletal ROS (+)   Abdominal   Peds  Hematology HLD   Anesthesia Other Findings PROSTATE CANCER  Reproductive/Obstetrics                            Anesthesia Physical Anesthesia Plan  ASA: II  Anesthesia Plan: MAC   Post-op Pain Management:    Induction: Intravenous  PONV Risk Score and Plan: 1 and Propofol infusion and Treatment may vary due to age or medical condition  Airway Management Planned: Simple Face Mask  Additional Equipment:   Intra-op Plan:   Post-operative Plan:   Informed Consent: I have reviewed the patients History and Physical, chart, labs and discussed the procedure including the risks, benefits and alternatives for the proposed anesthesia with the patient or authorized representative who has indicated his/her understanding and acceptance.     Dental advisory given  Plan Discussed with: CRNA  Anesthesia Plan Comments:        Anesthesia Quick Evaluation

## 2020-01-26 NOTE — H&P (Signed)
1. Prostate cancer  2. Erectile dysfunction   He returns today for further active surveillance management of his low risk prostate cancer. His last biopsy was now just over 2 years. He remains in stable overall health and denies any new voiding problems. He follows up today with his most recent PSA. He has continued to have refractory erectile dysfunction. We did discuss more aggressive treatment options at his last visit. Currently, he and his wife have not elected to pursue more aggressive therapy.     ALLERGIES: No Allergies    MEDICATIONS: Lisinopril  Viagra 50 mg tablet  Atorvastatin Calcium  Ecotrin Low Strength 81 MG Oral Tablet Delayed Release Oral  Fish Oil CAPS Oral  Folic Acid 0.4 mg tablet Oral  Multivitamin  Quinapril Hcl  Vitamin C 500 MG Oral Tablet Oral  Vitamin D3 1000 UNIT Oral Capsule Oral     GU PSH: Prostate Needle Biopsy - 2019, 2018 Vasectomy - 2011     NON-GU PSH: Diagnostic Colonoscopy - 2011 Repair of anal fissure Surgical Pathology, Gross And Microscopic Examination For Prostate Needle - 2019     GU PMH: Epididymal cyst - 2018 Prostate Cancer - 2018 ED due to arterial insufficiency - 2017 BPH w/LUTS - 2017      PMH Notes:   ** He requires biopsies to 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   1) Prostate cancer: He has a history of an elevated PSA and underwent a prostate biopsy in August 2011 that was negative. His PSA increased to 7.01 in 2017 and an MRI of the prostate in December 2017 demonstrated a PI-RADS 3 lesion in the right mid prostate (2.1 cm). An attempt was made to perform an MR/US fusion biopsy but this was not able to be performed due to anal stenosis. He therefore underwent a prostate biopsy with cognitive fusion by Dr. Gaynelle Arabian in February 2018 that confirmed low risk prostate cancer. He elected to proceed with active surveillance management.   Initial diagnosis: Feb 2018  TNM stage: T1c Nx Mx  PSA at diagnosis: 7.01   Gleason score: 3+3=6  Biopsy (08/22/16): 1/16 cores positive  Right: Benign  Left: L apex (5%)  Prostate volume: 35.6 cc   Surveillance:  May 2019: MR/US fusion biopsy - 26 cores (one core of atypia at right apex), Vol 36.5 cc      NON-GU PMH: Diverticulosis, Diverticulosis - 2014 Hypercholesterolemia Hypertension    FAMILY HISTORY: Brain Cancer - Mother Breast Cancer - Mother Family Health Status Number - Runs In Family Father Deceased At [place] ___ - Runs In Family Heart Disease - Brother, Mother Lung Cancer - Father Mother Deceased At Age 57 from diabetic complicati - Runs In Family   SOCIAL HISTORY: Marital Status: Married Preferred Language: English; Ethnicity: Not Hispanic Or Latino; Race: White Current Smoking Status: Patient has never smoked.  Has never drank.  Drinks 2 caffeinated drinks per day. Patient's occupation Research officer, political party.    REVIEW OF SYSTEMS:    GU Review Male:   Patient denies frequent urination, hard to postpone urination, trouble starting your streams, burning/ pain with urination, leakage of urine, stream starts and stops, get up at night to urinate, and have to strain to urinate .  Gastrointestinal (Upper):   Patient denies nausea and vomiting.  Gastrointestinal (Lower):   Patient denies diarrhea and constipation.  Constitutional:   Patient denies fever, night sweats, weight loss, and fatigue.  Skin:   Patient denies skin rash/ lesion and  itching.  Eyes:   Patient denies blurred vision and double vision.  Ears/ Nose/ Throat:   Patient denies sore throat and sinus problems.  Hematologic/Lymphatic:   Patient denies swollen glands and easy bruising.  Cardiovascular:   Patient denies leg swelling and chest pains.  Respiratory:   Patient denies cough and shortness of breath.  Endocrine:   Patient denies excessive thirst.  Musculoskeletal:   Patient denies back pain and joint pain.  Neurological:   Patient denies headaches and dizziness.   Psychologic:   Patient denies depression and anxiety.   VITAL SIGNS:     Weight 200 lb / 90.72 kg  Height 73 in / 185.42 cm  BP 120/70 mmHg  Pulse 69 /min  BMI 26.4 kg/m   GU PHYSICAL EXAMINATION:    Prostate: Prostate about 50 grams. He does have some new slight induration toward the right lateral apex. This was not previously noted. No nodularity otherwise.   MULTI-SYSTEM PHYSICAL EXAMINATION:    Constitutional: Well-nourished. No physical deformities. Normally developed. Good grooming.  Respiratory: No labored breathing, no use of accessory muscles.   Cardiovascular: Normal temperature, normal extremity pulses, no swelling, no varicosities.     Complexity of Data:  Lab Test Review:   PSA  Records Review:   Previous Patient Records  Urine Test Review:   Urinalysis   12/15/19 06/13/19 12/04/18 05/21/18 10/16/17 06/02/17 02/03/17 05/07/16  PSA  Total PSA 6.33 ng/mL 6.21 ng/mL 5.73 ng/mL 4.82 ng/mL 5.91 ng/mL 6.58 ng/mL 7.24 ng/mL 7.01 ng/dl  Free PSA        0.85 ng/dl  % Free PSA        12 %    05/21/18 10/16/17 06/02/17 02/03/17  Hormones  Testosterone, Total 564.4 ng/dL 723.7 ng/dL 535.4 ng/dL 636.4 ng/dL    PROCEDURES:          Urinalysis Dipstick Dipstick Cont'd  Color: Yellow Bilirubin: Neg mg/dL  Appearance: Clear Ketones: Neg mg/dL  Specific Gravity: 1.020 Blood: Neg ery/uL  pH: 6.0 Protein: Neg mg/dL  Glucose: Neg mg/dL Urobilinogen: 0.2 mg/dL    Nitrites: Neg    Leukocyte Esterase: Neg leu/uL    ASSESSMENT:      ICD-10 Details  1 GU:   Prostate Cancer - C61   2   ED due to arterial insufficiency - N52.01    PLAN:           Schedule Return Visit/Planned Activity: Other See Visit Notes             Note: Will call to schedule surgery.          Document Letter(s):  Created for Patient: Clinical Summary         Notes:   1. Prostate cancer: We discussed his rectal exam findings and his PSA. I have recommended that he proceed with a surveillance  protocol biopsy. Considering his anal stenosis, this will be performed under IV sedation in the operating room. We reviewed the potential risks and complications. This will be scheduled for the near future.   2. Erectile dysfunction: He does not currently wish to pursue therapy.   Cc: Dr. Kathyrn Lass

## 2020-01-27 ENCOUNTER — Encounter (HOSPITAL_COMMUNITY): Payer: Self-pay | Admitting: Urology

## 2020-01-27 ENCOUNTER — Ambulatory Visit (HOSPITAL_COMMUNITY): Payer: Medicare Other | Admitting: Certified Registered"

## 2020-01-27 ENCOUNTER — Ambulatory Visit (HOSPITAL_COMMUNITY)
Admission: RE | Admit: 2020-01-27 | Discharge: 2020-01-27 | Disposition: A | Discharge status: Home / Self Care | Payer: Medicare Other | Source: Ambulatory Visit | Attending: Urology | Admitting: Urology

## 2020-01-27 ENCOUNTER — Encounter (HOSPITAL_COMMUNITY)
Admission: RE | Disposition: A | Discharge status: Home / Self Care | Payer: Self-pay | Source: Other Acute Inpatient Hospital | Attending: Urology

## 2020-01-27 ENCOUNTER — Ambulatory Visit (HOSPITAL_COMMUNITY)
Admission: RE | Admit: 2020-01-27 | Discharge: 2020-01-27 | Disposition: A | Discharge status: Home / Self Care | Payer: Medicare Other | Source: Other Acute Inpatient Hospital | Attending: Urology | Admitting: Urology

## 2020-01-27 DIAGNOSIS — I10 Essential (primary) hypertension: Secondary | ICD-10-CM | POA: Diagnosis not present

## 2020-01-27 DIAGNOSIS — N5201 Erectile dysfunction due to arterial insufficiency: Secondary | ICD-10-CM | POA: Insufficient documentation

## 2020-01-27 DIAGNOSIS — Z7982 Long term (current) use of aspirin: Secondary | ICD-10-CM | POA: Diagnosis not present

## 2020-01-27 DIAGNOSIS — C61 Malignant neoplasm of prostate: Secondary | ICD-10-CM | POA: Insufficient documentation

## 2020-01-27 DIAGNOSIS — Z79899 Other long term (current) drug therapy: Secondary | ICD-10-CM | POA: Diagnosis not present

## 2020-01-27 DIAGNOSIS — E7800 Pure hypercholesterolemia, unspecified: Secondary | ICD-10-CM | POA: Diagnosis not present

## 2020-01-27 HISTORY — PX: PROSTATE BIOPSY: SHX241

## 2020-01-27 SURGERY — BIOPSY, PROSTATE, RECTAL APPROACH, WITH US GUIDANCE
Anesthesia: Monitor Anesthesia Care

## 2020-01-27 MED ORDER — FENTANYL CITRATE (PF) 100 MCG/2ML IJ SOLN
INTRAMUSCULAR | Status: DC | PRN
  Administered 2020-01-27 (×2): 50 ug via INTRAVENOUS

## 2020-01-27 MED ORDER — PROPOFOL 500 MG/50ML IV EMUL
INTRAVENOUS | Status: DC | PRN
  Administered 2020-01-27: 125 ug/kg/min via INTRAVENOUS

## 2020-01-27 MED ORDER — SODIUM CHLORIDE 0.9 % IV SOLN
INTRAVENOUS | Status: AC
  Filled 2020-01-27: qty 20

## 2020-01-27 MED ORDER — SODIUM CHLORIDE 0.9 % IV SOLN
2.0000 g | Freq: Once | INTRAVENOUS | Status: AC
  Administered 2020-01-27: 2 g via INTRAVENOUS

## 2020-01-27 MED ORDER — CHLORHEXIDINE GLUCONATE 0.12 % MT SOLN
15.0000 mL | Freq: Once | OROMUCOSAL | Status: AC
  Administered 2020-01-27: 15 mL via OROMUCOSAL

## 2020-01-27 MED ORDER — LIDOCAINE 2% (20 MG/ML) 5 ML SYRINGE
INTRAMUSCULAR | Status: DC | PRN
  Administered 2020-01-27: 40 mg via INTRAVENOUS

## 2020-01-27 MED ORDER — ONDANSETRON HCL 4 MG/2ML IJ SOLN: 4.0000 mg | Freq: Once | INTRAMUSCULAR | Status: DC | PRN

## 2020-01-27 MED ORDER — PROPOFOL 10 MG/ML IV BOLUS
INTRAVENOUS | Status: DC | PRN
  Administered 2020-01-27: 20 mg via INTRAVENOUS

## 2020-01-27 MED ORDER — MIDAZOLAM HCL 2 MG/2ML IJ SOLN
INTRAMUSCULAR | Status: DC | PRN
  Administered 2020-01-27: 2 mg via INTRAVENOUS

## 2020-01-27 MED ORDER — ACETAMINOPHEN 500 MG PO TABS
ORAL_TABLET | ORAL | Status: AC
  Administered 2020-01-27: 1000 mg via ORAL
  Filled 2020-01-27: qty 2

## 2020-01-27 MED ORDER — LIDOCAINE HCL 2 % IJ SOLN
INTRAMUSCULAR | Status: AC
  Filled 2020-01-27: qty 20

## 2020-01-27 MED ORDER — FENTANYL CITRATE (PF) 100 MCG/2ML IJ SOLN: 25.0000 ug | INTRAMUSCULAR | Status: DC | PRN

## 2020-01-27 MED ORDER — ORAL CARE MOUTH RINSE: 15.0000 mL | Freq: Once | OROMUCOSAL | Status: AC

## 2020-01-27 MED ORDER — FENTANYL CITRATE (PF) 100 MCG/2ML IJ SOLN
INTRAMUSCULAR | Status: AC
  Filled 2020-01-27: qty 2

## 2020-01-27 MED ORDER — FLEET ENEMA 7-19 GM/118ML RE ENEM
1.0000 | ENEMA | Freq: Once | RECTAL | Status: DC
  Filled 2020-01-27: qty 1

## 2020-01-27 MED ORDER — LACTATED RINGERS IV SOLN: INTRAVENOUS | Status: DC

## 2020-01-27 MED ORDER — LIDOCAINE 2% (20 MG/ML) 5 ML SYRINGE
INTRAMUSCULAR | Status: AC
  Filled 2020-01-27: qty 5

## 2020-01-27 MED ORDER — LIDOCAINE HCL 2 % IJ SOLN
INTRAMUSCULAR | Status: DC | PRN
  Administered 2020-01-27: 10 mL

## 2020-01-27 MED ORDER — MIDAZOLAM HCL 2 MG/2ML IJ SOLN
INTRAMUSCULAR | Status: AC
  Filled 2020-01-27: qty 2

## 2020-01-27 MED ORDER — DEXAMETHASONE SODIUM PHOSPHATE 10 MG/ML IJ SOLN
INTRAMUSCULAR | Status: AC
  Filled 2020-01-27: qty 1

## 2020-01-27 MED ORDER — ONDANSETRON HCL 4 MG/2ML IJ SOLN
INTRAMUSCULAR | Status: AC
  Filled 2020-01-27: qty 2

## 2020-01-27 MED ORDER — KETOROLAC TROMETHAMINE 15 MG/ML IJ SOLN: 15.0000 mg | Freq: Once | INTRAMUSCULAR | Status: DC | PRN

## 2020-01-27 MED ORDER — ACETAMINOPHEN 500 MG PO TABS: 1000.0000 mg | ORAL_TABLET | Freq: Once | ORAL | Status: AC

## 2020-01-27 MED ORDER — PROPOFOL 10 MG/ML IV BOLUS
INTRAVENOUS | Status: AC
  Filled 2020-01-27: qty 20

## 2020-01-27 MED ORDER — ONDANSETRON HCL 4 MG/2ML IJ SOLN
INTRAMUSCULAR | Status: DC | PRN
  Administered 2020-01-27: 4 mg via INTRAVENOUS

## 2020-01-27 SURGICAL SUPPLY — 8 items
COVER SURGICAL LIGHT HANDLE (MISCELLANEOUS) IMPLANT
INST BIOPSY MAXCORE 18GX25 (NEEDLE) IMPLANT
INSTR BIOPSY MAXCORE 18GX20 (NEEDLE) ×3 IMPLANT
KIT TURNOVER KIT A (KITS) IMPLANT
NEEDLE SPNL 22GX7 QUINCKE BK (NEEDLE) ×3 IMPLANT
PENCIL SMOKE EVACUATOR (MISCELLANEOUS) IMPLANT
SYR CONTROL 10ML LL (SYRINGE) IMPLANT
UNDERPAD 30X36 HEAVY ABSORB (UNDERPADS AND DIAPERS) ×3 IMPLANT

## 2020-01-27 NOTE — Interval H&P Note (Signed)
History and Physical Interval Note:  01/27/2020 7:00 AM  Jim Mcknight  has presented today for surgery, with the diagnosis of PROSTATE CANCER.  The various methods of treatment have been discussed with the patient and family. After consideration of risks, benefits and other options for treatment, the patient has consented to  Procedure(s): BIOPSY TRANSRECTAL ULTRASONIC PROSTATE (TUBP) (N/A) as a surgical intervention.  The patient's history has been reviewed, patient examined, no change in status, stable for surgery.  I have reviewed the patient's chart and labs.  Questions were answered to the patient's satisfaction.     Les [employer]

## 2020-01-27 NOTE — Anesthesia Postprocedure Evaluation (Signed)
Anesthesia Post Note  Patient: Jim Mcknight  Procedure(s) Performed: BIOPSY TRANSRECTAL ULTRASONIC PROSTATE (TUBP) (N/A )     Patient location during evaluation: PACU Anesthesia Type: MAC Level of consciousness: awake and alert Pain management: pain level controlled Vital Signs Assessment: post-procedure vital signs reviewed and stable Respiratory status: spontaneous breathing, nonlabored ventilation, respiratory function stable and patient connected to nasal cannula oxygen Cardiovascular status: stable and blood pressure returned to baseline Postop Assessment: no apparent nausea or vomiting Anesthetic complications: no   No complications documented.  Last Vitals:  Vitals:   01/27/20 0815 01/27/20 0830  BP: 103/74 110/74  Pulse: 62 68  Resp: 14 23  Temp:    SpO2: 100% 96%    Last Pain:  Vitals:   01/27/20 0830  TempSrc:   PainSc: 0-No pain                 Matie Dimaano P Francies Inch

## 2020-01-27 NOTE — Anesthesia Procedure Notes (Signed)
Procedure Name: MAC Date/Time: 01/27/2020 7:12 AM Performed by: Eben Burow, CRNA Pre-anesthesia Checklist: Patient identified, Emergency Drugs available, Suction available, Patient being monitored and Timeout performed Oxygen Delivery Method: Simple face mask Placement Confirmation: positive ETCO2

## 2020-01-27 NOTE — Discharge Instructions (Signed)
1. Call if fever > 101 or excessive bleeding.  Some blood in the urine or with bowel movements is expected for 1-3 days. 2. You may resume aspirin and other supplements that you stopped in 48 hrs or once bleeding has stopped if longer than 48 hrs. 3. Dr. Alinda Money will call you next week with the biopsy results once they have returned.

## 2020-01-27 NOTE — Op Note (Signed)
Preoperative diagnosis: Prostate cancer  Postoperative diagnosis: Prostate cancer  Procedure: Transrectal ultrasound guided prostate needle biopsy, transrectal ultrasound of the prostate  Surgeon: Pryor Curia MD  Anesthesia: IV sedation  Complications: None  EBL: Minimal  Specimens: 1.  Right lateral base prostate 2.  Right medial base prostate 3.  Right lateral mid prostate 4.  Right medial mid prostate 5.  Right lateral apex prostate 6.  Right medial apex prostate 7.  Left lateral base prostate 8.  Left medial base prostate 9.  Left lateral mid prostate 10.  Left medial mid prostate 11.  Left lateral apex prostate 12.  Left medial apex prostate  Disposition of specimens: Pathology  Indication: Mr. Jim Mcknight is a 69 year old gentleman with a history of low risk prostate cancer on active surveillance management.  He presents today for a surveillance protocol biopsy for further evaluation.  His most recent PSA was 6.33.  He was noted to have some slight induration toward the right lateral apex.  The potential risks, complications, and the alternative options associated with the above procedure were discussed.  Informed consent was obtained.  Description of procedure: The patient was taken the operating room and IV sedation was administered.  He was placed in the left lateral decubitus position, and a preoperative timeout was performed.  He had been administered preoperative antibiotics.  2% lidocaine jelly was inserted into the rectum to anesthetize the rectal wall.  The 10 MHz rectal ultrasound probe was then inserted into the rectum.  10 cc of 2% Xylocaine without epinephrine was instilled via a spinal needle near the junction of each seminal vesicle and the prostate base for a periprostatic nerve block.  Sequential axial and sagittal images were then obtained beginning first at the seminal vesicles and examined and transverse images through the apex of the prostate.  In  addition, sequential images were obtained in a sagittal fashion from the right lateral prostate to the left lateral prostate.  No concerning hypoechoic areas or other abnormalities were identified.  No median lobe was identified.  The prostate volume was then measured and was estimated to be 43.9 cm.  12 needle biopsies were then obtained all under direct transrectal ultrasound guidance.  These needle biopsies were obtained from the left and right apex, mid, and base regions of the prostate including both the lateral and parasagittal area of each sextant region.  The biopsies were placed in formalin.  Follow-up rectal examination was then performed and there did not appear to be any significant bleeding.  The patient was monitored closely and was transferred to the recovery unit in satisfactory condition.

## 2020-01-27 NOTE — Transfer of Care (Signed)
Immediate Anesthesia Transfer of Care Note  Patient: Jim Mcknight  Procedure(s) Performed: BIOPSY TRANSRECTAL ULTRASONIC PROSTATE (TUBP) (N/A )  Patient Location: PACU  Anesthesia Type:MAC  Level of Consciousness: drowsy and responds to stimulation  Airway & Oxygen Therapy: Patient Spontanous Breathing and Patient connected to face mask oxygen  Post-op Assessment: Report given to RN and Post -op Vital signs reviewed and stable  Post vital signs: Reviewed and stable  Last Vitals:  Vitals Value Taken Time  BP    Temp    Pulse    Resp    SpO2      Last Pain:  Vitals:   01/27/20 0609  TempSrc: Oral         Complications: No complications documented.

## 2020-01-28 ENCOUNTER — Encounter (HOSPITAL_COMMUNITY): Payer: Self-pay | Admitting: Urology

## 2020-01-28 LAB — SURGICAL PATHOLOGY

## 2020-02-16 DIAGNOSIS — D2262 Melanocytic nevi of left upper limb, including shoulder: Secondary | ICD-10-CM | POA: Diagnosis not present

## 2020-02-16 DIAGNOSIS — D1801 Hemangioma of skin and subcutaneous tissue: Secondary | ICD-10-CM | POA: Diagnosis not present

## 2020-02-16 DIAGNOSIS — D2271 Melanocytic nevi of right lower limb, including hip: Secondary | ICD-10-CM | POA: Diagnosis not present

## 2020-02-16 DIAGNOSIS — D225 Melanocytic nevi of trunk: Secondary | ICD-10-CM | POA: Diagnosis not present

## 2020-02-16 DIAGNOSIS — L570 Actinic keratosis: Secondary | ICD-10-CM | POA: Diagnosis not present

## 2020-02-16 DIAGNOSIS — D2272 Melanocytic nevi of left lower limb, including hip: Secondary | ICD-10-CM | POA: Diagnosis not present

## 2020-02-16 DIAGNOSIS — D2261 Melanocytic nevi of right upper limb, including shoulder: Secondary | ICD-10-CM | POA: Diagnosis not present

## 2020-02-16 DIAGNOSIS — L821 Other seborrheic keratosis: Secondary | ICD-10-CM | POA: Diagnosis not present

## 2020-02-16 DIAGNOSIS — L820 Inflamed seborrheic keratosis: Secondary | ICD-10-CM | POA: Diagnosis not present

## 2020-02-16 DIAGNOSIS — Z85820 Personal history of malignant melanoma of skin: Secondary | ICD-10-CM | POA: Diagnosis not present

## 2020-02-16 DIAGNOSIS — Z85828 Personal history of other malignant neoplasm of skin: Secondary | ICD-10-CM | POA: Diagnosis not present

## 2020-02-16 DIAGNOSIS — B351 Tinea unguium: Secondary | ICD-10-CM | POA: Diagnosis not present

## 2020-03-16 DIAGNOSIS — R300 Dysuria: Secondary | ICD-10-CM | POA: Diagnosis not present

## 2020-04-25 DIAGNOSIS — Z23 Encounter for immunization: Secondary | ICD-10-CM | POA: Diagnosis not present

## 2020-05-10 DIAGNOSIS — Z23 Encounter for immunization: Secondary | ICD-10-CM | POA: Diagnosis not present

## 2020-08-18 DIAGNOSIS — C61 Malignant neoplasm of prostate: Secondary | ICD-10-CM | POA: Diagnosis not present

## 2020-08-23 DIAGNOSIS — D2271 Melanocytic nevi of right lower limb, including hip: Secondary | ICD-10-CM | POA: Diagnosis not present

## 2020-08-23 DIAGNOSIS — L821 Other seborrheic keratosis: Secondary | ICD-10-CM | POA: Diagnosis not present

## 2020-08-23 DIAGNOSIS — Z85820 Personal history of malignant melanoma of skin: Secondary | ICD-10-CM | POA: Diagnosis not present

## 2020-08-23 DIAGNOSIS — L570 Actinic keratosis: Secondary | ICD-10-CM | POA: Diagnosis not present

## 2020-08-23 DIAGNOSIS — D2272 Melanocytic nevi of left lower limb, including hip: Secondary | ICD-10-CM | POA: Diagnosis not present

## 2020-08-23 DIAGNOSIS — D485 Neoplasm of uncertain behavior of skin: Secondary | ICD-10-CM | POA: Diagnosis not present

## 2020-08-23 DIAGNOSIS — Z85828 Personal history of other malignant neoplasm of skin: Secondary | ICD-10-CM | POA: Diagnosis not present

## 2020-08-23 DIAGNOSIS — L820 Inflamed seborrheic keratosis: Secondary | ICD-10-CM | POA: Diagnosis not present

## 2020-08-23 DIAGNOSIS — D225 Melanocytic nevi of trunk: Secondary | ICD-10-CM | POA: Diagnosis not present

## 2020-08-23 DIAGNOSIS — D2262 Melanocytic nevi of left upper limb, including shoulder: Secondary | ICD-10-CM | POA: Diagnosis not present

## 2020-08-25 DIAGNOSIS — C61 Malignant neoplasm of prostate: Secondary | ICD-10-CM | POA: Diagnosis not present

## 2020-12-04 DIAGNOSIS — Z1389 Encounter for screening for other disorder: Secondary | ICD-10-CM | POA: Diagnosis not present

## 2020-12-04 DIAGNOSIS — Z0000 Encounter for general adult medical examination without abnormal findings: Secondary | ICD-10-CM | POA: Diagnosis not present

## 2020-12-04 DIAGNOSIS — E7800 Pure hypercholesterolemia, unspecified: Secondary | ICD-10-CM | POA: Diagnosis not present

## 2020-12-12 DIAGNOSIS — Z6828 Body mass index (BMI) 28.0-28.9, adult: Secondary | ICD-10-CM | POA: Diagnosis not present

## 2020-12-12 DIAGNOSIS — E7800 Pure hypercholesterolemia, unspecified: Secondary | ICD-10-CM | POA: Diagnosis not present

## 2020-12-12 DIAGNOSIS — I10 Essential (primary) hypertension: Secondary | ICD-10-CM | POA: Diagnosis not present

## 2021-03-01 DIAGNOSIS — D225 Melanocytic nevi of trunk: Secondary | ICD-10-CM | POA: Diagnosis not present

## 2021-03-01 DIAGNOSIS — L821 Other seborrheic keratosis: Secondary | ICD-10-CM | POA: Diagnosis not present

## 2021-03-01 DIAGNOSIS — L812 Freckles: Secondary | ICD-10-CM | POA: Diagnosis not present

## 2021-03-01 DIAGNOSIS — Z85828 Personal history of other malignant neoplasm of skin: Secondary | ICD-10-CM | POA: Diagnosis not present

## 2021-03-01 DIAGNOSIS — L570 Actinic keratosis: Secondary | ICD-10-CM | POA: Diagnosis not present

## 2021-03-01 DIAGNOSIS — Z85820 Personal history of malignant melanoma of skin: Secondary | ICD-10-CM | POA: Diagnosis not present

## 2021-03-15 DIAGNOSIS — Z23 Encounter for immunization: Secondary | ICD-10-CM | POA: Diagnosis not present

## 2021-04-05 DIAGNOSIS — Z23 Encounter for immunization: Secondary | ICD-10-CM | POA: Diagnosis not present

## 2021-04-20 DIAGNOSIS — C61 Malignant neoplasm of prostate: Secondary | ICD-10-CM | POA: Diagnosis not present

## 2021-04-27 DIAGNOSIS — C61 Malignant neoplasm of prostate: Secondary | ICD-10-CM | POA: Diagnosis not present

## 2021-09-05 DIAGNOSIS — D485 Neoplasm of uncertain behavior of skin: Secondary | ICD-10-CM | POA: Diagnosis not present

## 2021-09-05 DIAGNOSIS — Z85820 Personal history of malignant melanoma of skin: Secondary | ICD-10-CM | POA: Diagnosis not present

## 2021-09-05 DIAGNOSIS — B351 Tinea unguium: Secondary | ICD-10-CM | POA: Diagnosis not present

## 2021-09-05 DIAGNOSIS — L570 Actinic keratosis: Secondary | ICD-10-CM | POA: Diagnosis not present

## 2021-09-05 DIAGNOSIS — L821 Other seborrheic keratosis: Secondary | ICD-10-CM | POA: Diagnosis not present

## 2021-09-05 DIAGNOSIS — D2272 Melanocytic nevi of left lower limb, including hip: Secondary | ICD-10-CM | POA: Diagnosis not present

## 2021-09-05 DIAGNOSIS — D045 Carcinoma in situ of skin of trunk: Secondary | ICD-10-CM | POA: Diagnosis not present

## 2021-09-05 DIAGNOSIS — D0462 Carcinoma in situ of skin of left upper limb, including shoulder: Secondary | ICD-10-CM | POA: Diagnosis not present

## 2021-09-05 DIAGNOSIS — L820 Inflamed seborrheic keratosis: Secondary | ICD-10-CM | POA: Diagnosis not present

## 2021-09-05 DIAGNOSIS — B078 Other viral warts: Secondary | ICD-10-CM | POA: Diagnosis not present

## 2021-09-05 DIAGNOSIS — D2271 Melanocytic nevi of right lower limb, including hip: Secondary | ICD-10-CM | POA: Diagnosis not present

## 2021-09-05 DIAGNOSIS — Z85828 Personal history of other malignant neoplasm of skin: Secondary | ICD-10-CM | POA: Diagnosis not present

## 2021-10-08 DIAGNOSIS — Z03818 Encounter for observation for suspected exposure to other biological agents ruled out: Secondary | ICD-10-CM | POA: Diagnosis not present

## 2021-10-08 DIAGNOSIS — R059 Cough, unspecified: Secondary | ICD-10-CM | POA: Diagnosis not present

## 2021-10-08 DIAGNOSIS — I10 Essential (primary) hypertension: Secondary | ICD-10-CM | POA: Diagnosis not present

## 2021-10-24 DIAGNOSIS — H43811 Vitreous degeneration, right eye: Secondary | ICD-10-CM | POA: Diagnosis not present

## 2021-11-16 DIAGNOSIS — M79645 Pain in left finger(s): Secondary | ICD-10-CM | POA: Diagnosis not present

## 2021-11-29 DIAGNOSIS — M7989 Other specified soft tissue disorders: Secondary | ICD-10-CM | POA: Diagnosis not present

## 2021-12-03 ENCOUNTER — Other Ambulatory Visit: Payer: Self-pay | Admitting: Orthopedic Surgery

## 2021-12-05 DIAGNOSIS — E7800 Pure hypercholesterolemia, unspecified: Secondary | ICD-10-CM | POA: Diagnosis not present

## 2021-12-05 DIAGNOSIS — Z1389 Encounter for screening for other disorder: Secondary | ICD-10-CM | POA: Diagnosis not present

## 2021-12-05 DIAGNOSIS — Z0000 Encounter for general adult medical examination without abnormal findings: Secondary | ICD-10-CM | POA: Diagnosis not present

## 2021-12-13 DIAGNOSIS — Z6827 Body mass index (BMI) 27.0-27.9, adult: Secondary | ICD-10-CM | POA: Diagnosis not present

## 2021-12-13 DIAGNOSIS — E7800 Pure hypercholesterolemia, unspecified: Secondary | ICD-10-CM | POA: Diagnosis not present

## 2021-12-13 DIAGNOSIS — C61 Malignant neoplasm of prostate: Secondary | ICD-10-CM | POA: Diagnosis not present

## 2021-12-13 DIAGNOSIS — I10 Essential (primary) hypertension: Secondary | ICD-10-CM | POA: Diagnosis not present

## 2021-12-14 DIAGNOSIS — C61 Malignant neoplasm of prostate: Secondary | ICD-10-CM | POA: Diagnosis not present

## 2021-12-17 DIAGNOSIS — E7800 Pure hypercholesterolemia, unspecified: Secondary | ICD-10-CM | POA: Diagnosis not present

## 2021-12-17 DIAGNOSIS — I10 Essential (primary) hypertension: Secondary | ICD-10-CM | POA: Diagnosis not present

## 2021-12-18 DIAGNOSIS — H52222 Regular astigmatism, left eye: Secondary | ICD-10-CM | POA: Diagnosis not present

## 2021-12-18 DIAGNOSIS — H524 Presbyopia: Secondary | ICD-10-CM | POA: Diagnosis not present

## 2021-12-21 ENCOUNTER — Other Ambulatory Visit: Payer: Self-pay

## 2021-12-21 ENCOUNTER — Encounter (HOSPITAL_BASED_OUTPATIENT_CLINIC_OR_DEPARTMENT_OTHER): Payer: Self-pay | Admitting: Orthopedic Surgery

## 2021-12-21 ENCOUNTER — Other Ambulatory Visit: Payer: Self-pay | Admitting: Orthopedic Surgery

## 2021-12-21 DIAGNOSIS — M7989 Other specified soft tissue disorders: Secondary | ICD-10-CM | POA: Diagnosis not present

## 2021-12-21 DIAGNOSIS — C61 Malignant neoplasm of prostate: Secondary | ICD-10-CM | POA: Diagnosis not present

## 2021-12-25 ENCOUNTER — Encounter (HOSPITAL_BASED_OUTPATIENT_CLINIC_OR_DEPARTMENT_OTHER)
Admission: RE | Admit: 2021-12-25 | Discharge: 2021-12-25 | Disposition: A | Discharge status: Home / Self Care | Payer: PRIVATE HEALTH INSURANCE | Source: Ambulatory Visit | Attending: Orthopedic Surgery | Admitting: Orthopedic Surgery

## 2021-12-25 DIAGNOSIS — Z01818 Encounter for other preprocedural examination: Secondary | ICD-10-CM | POA: Insufficient documentation

## 2021-12-25 LAB — BASIC METABOLIC PANEL
Anion gap: 10 (ref 5–15)
BUN: 13 mg/dL (ref 8–23)
CO2: 24 mmol/L (ref 22–32)
Calcium: 8.8 mg/dL — ABNORMAL LOW (ref 8.9–10.3)
Chloride: 104 mmol/L (ref 98–111)
Creatinine, Ser: 1.41 mg/dL — ABNORMAL HIGH (ref 0.61–1.24)
GFR, Estimated: 54 mL/min — ABNORMAL LOW (ref >60)
Glucose, Bld: 107 mg/dL — ABNORMAL HIGH (ref 70–99)
Potassium: 3.7 mmol/L (ref 3.5–5.1)
Sodium: 138 mmol/L (ref 135–145)

## 2021-12-25 NOTE — Progress Notes (Signed)

## 2021-12-26 DIAGNOSIS — Z85820 Personal history of malignant melanoma of skin: Secondary | ICD-10-CM | POA: Diagnosis not present

## 2021-12-26 DIAGNOSIS — C44329 Squamous cell carcinoma of skin of other parts of face: Secondary | ICD-10-CM | POA: Diagnosis not present

## 2021-12-26 DIAGNOSIS — Z85828 Personal history of other malignant neoplasm of skin: Secondary | ICD-10-CM | POA: Diagnosis not present

## 2021-12-26 DIAGNOSIS — D485 Neoplasm of uncertain behavior of skin: Secondary | ICD-10-CM | POA: Diagnosis not present

## 2022-01-07 ENCOUNTER — Encounter (HOSPITAL_BASED_OUTPATIENT_CLINIC_OR_DEPARTMENT_OTHER)
Admission: RE | Disposition: A | Discharge status: Home / Self Care | Payer: Self-pay | Source: Home / Self Care | Attending: Orthopedic Surgery

## 2022-01-07 ENCOUNTER — Encounter (HOSPITAL_BASED_OUTPATIENT_CLINIC_OR_DEPARTMENT_OTHER): Payer: Self-pay | Admitting: Orthopedic Surgery

## 2022-01-07 ENCOUNTER — Ambulatory Visit (HOSPITAL_BASED_OUTPATIENT_CLINIC_OR_DEPARTMENT_OTHER): Payer: No Typology Code available for payment source | Admitting: Anesthesiology

## 2022-01-07 ENCOUNTER — Other Ambulatory Visit: Payer: Self-pay

## 2022-01-07 ENCOUNTER — Ambulatory Visit (HOSPITAL_BASED_OUTPATIENT_CLINIC_OR_DEPARTMENT_OTHER)
Admission: RE | Admit: 2022-01-07 | Discharge: 2022-01-07 | Disposition: A | Discharge status: Home / Self Care | Payer: No Typology Code available for payment source | Attending: Orthopedic Surgery | Admitting: Orthopedic Surgery

## 2022-01-07 DIAGNOSIS — D481 Neoplasm of uncertain behavior of connective and other soft tissue: Secondary | ICD-10-CM | POA: Insufficient documentation

## 2022-01-07 DIAGNOSIS — Z01818 Encounter for other preprocedural examination: Secondary | ICD-10-CM

## 2022-01-07 DIAGNOSIS — D492 Neoplasm of unspecified behavior of bone, soft tissue, and skin: Secondary | ICD-10-CM | POA: Diagnosis not present

## 2022-01-07 DIAGNOSIS — I10 Essential (primary) hypertension: Secondary | ICD-10-CM | POA: Insufficient documentation

## 2022-01-07 DIAGNOSIS — R2232 Localized swelling, mass and lump, left upper limb: Secondary | ICD-10-CM

## 2022-01-07 HISTORY — PX: TENDON EXPLORATION: SHX5112

## 2022-01-07 HISTORY — PX: EXCISION METACARPAL MASS: SHX6372

## 2022-01-07 SURGERY — EXCISION METACARPAL MASS
Anesthesia: Monitor Anesthesia Care | Site: Thumb | Laterality: Left

## 2022-01-07 MED ORDER — ACETAMINOPHEN 500 MG PO TABS
1000.0000 mg | ORAL_TABLET | Freq: Once | ORAL | Status: AC
Start: 2022-01-07 — End: 2022-01-07
  Administered 2022-01-07: 1000 mg via ORAL

## 2022-01-07 MED ORDER — OXYCODONE HCL 5 MG/5ML PO SOLN: 5.0000 mg | Freq: Once | ORAL | Status: DC | PRN

## 2022-01-07 MED ORDER — ACETAMINOPHEN 500 MG PO TABS
ORAL_TABLET | ORAL | Status: AC
  Filled 2022-01-07: qty 2

## 2022-01-07 MED ORDER — EPHEDRINE 5 MG/ML INJ
INTRAVENOUS | Status: AC
  Filled 2022-01-07: qty 5

## 2022-01-07 MED ORDER — PROPOFOL 500 MG/50ML IV EMUL
INTRAVENOUS | Status: DC | PRN
  Administered 2022-01-07: 100 ug/kg/min via INTRAVENOUS

## 2022-01-07 MED ORDER — LACTATED RINGERS IV SOLN: INTRAVENOUS | Status: DC

## 2022-01-07 MED ORDER — LIDOCAINE HCL (PF) 0.5 % IJ SOLN
INTRAMUSCULAR | Status: DC | PRN
  Administered 2022-01-07: 30 mL via INTRAVENOUS

## 2022-01-07 MED ORDER — HYDROCODONE-ACETAMINOPHEN 5-325 MG PO TABS: ORAL_TABLET | ORAL | 0 refills | Status: DC

## 2022-01-07 MED ORDER — BUPIVACAINE HCL (PF) 0.25 % IJ SOLN
INTRAMUSCULAR | Status: DC | PRN
  Administered 2022-01-07: 9 mL

## 2022-01-07 MED ORDER — PHENYLEPHRINE 80 MCG/ML (10ML) SYRINGE FOR IV PUSH (FOR BLOOD PRESSURE SUPPORT)
PREFILLED_SYRINGE | INTRAVENOUS | Status: AC
  Filled 2022-01-07: qty 20

## 2022-01-07 MED ORDER — 0.9 % SODIUM CHLORIDE (POUR BTL) OPTIME
TOPICAL | Status: DC | PRN
  Administered 2022-01-07: 100 mL

## 2022-01-07 MED ORDER — OXYCODONE HCL 5 MG PO TABS: 5.0000 mg | ORAL_TABLET | Freq: Once | ORAL | Status: DC | PRN

## 2022-01-07 MED ORDER — CEFAZOLIN SODIUM-DEXTROSE 2-4 GM/100ML-% IV SOLN
INTRAVENOUS | Status: AC
  Filled 2022-01-07: qty 100

## 2022-01-07 MED ORDER — FENTANYL CITRATE (PF) 100 MCG/2ML IJ SOLN
INTRAMUSCULAR | Status: DC | PRN
Start: 2022-01-07 — End: 2022-01-07
  Administered 2022-01-07: 50 ug via INTRAVENOUS

## 2022-01-07 MED ORDER — ONDANSETRON HCL 4 MG/2ML IJ SOLN
INTRAMUSCULAR | Status: DC | PRN
  Administered 2022-01-07: 4 mg via INTRAVENOUS

## 2022-01-07 MED ORDER — FENTANYL CITRATE (PF) 100 MCG/2ML IJ SOLN
INTRAMUSCULAR | Status: AC
  Filled 2022-01-07: qty 2

## 2022-01-07 MED ORDER — CEFAZOLIN SODIUM-DEXTROSE 2-4 GM/100ML-% IV SOLN
2.0000 g | INTRAVENOUS | Status: AC
  Administered 2022-01-07: 2 g via INTRAVENOUS

## 2022-01-07 MED ORDER — MIDAZOLAM HCL 2 MG/2ML IJ SOLN: 0.5000 mg | Freq: Once | INTRAMUSCULAR | Status: DC | PRN

## 2022-01-07 MED ORDER — FENTANYL CITRATE (PF) 100 MCG/2ML IJ SOLN: 25.0000 ug | INTRAMUSCULAR | Status: DC | PRN

## 2022-01-07 SURGICAL SUPPLY — 56 items
APL PRP STRL LF DISP 70% ISPRP (MISCELLANEOUS) ×1
APL SKNCLS STERI-STRIP NONHPOA (GAUZE/BANDAGES/DRESSINGS)
BANDAGE GAUZE 1X75IN STRL (MISCELLANEOUS) IMPLANT
BENZOIN TINCTURE PRP APPL 2/3 (GAUZE/BANDAGES/DRESSINGS) IMPLANT
BLADE MINI RND TIP GREEN BEAV (BLADE) IMPLANT
BLADE SURG 15 STRL LF DISP TIS (BLADE) ×2 IMPLANT
BLADE SURG 15 STRL SS (BLADE) ×4
BNDG CMPR 5X2 CHSV 1 LYR STRL (GAUZE/BANDAGES/DRESSINGS)
BNDG CMPR 75X11 PLY HI ABS (MISCELLANEOUS)
BNDG CMPR 75X21 PLY HI ABS (MISCELLANEOUS)
BNDG CMPR 9X4 STRL LF SNTH (GAUZE/BANDAGES/DRESSINGS)
BNDG COHESIVE 1X5 TAN STRL LF (GAUZE/BANDAGES/DRESSINGS) IMPLANT
BNDG COHESIVE 2X5 TAN ST LF (GAUZE/BANDAGES/DRESSINGS) IMPLANT
BNDG ELASTIC 2X5.8 VLCR STR LF (GAUZE/BANDAGES/DRESSINGS) IMPLANT
BNDG ELASTIC 3X5.8 VLCR STR LF (GAUZE/BANDAGES/DRESSINGS) IMPLANT
BNDG ESMARK 4X9 LF (GAUZE/BANDAGES/DRESSINGS) IMPLANT
BNDG GAUZE 1X75IN STRL (MISCELLANEOUS)
BNDG GAUZE DERMACEA FLUFF (GAUZE/BANDAGES/DRESSINGS)
BNDG GAUZE DERMACEA FLUFF 4 (GAUZE/BANDAGES/DRESSINGS) IMPLANT
BNDG GZE DERMACEA 4 6PLY (GAUZE/BANDAGES/DRESSINGS)
BNDG PLASTER X FAST 3X3 WHT LF (CAST SUPPLIES) IMPLANT
BNDG PLSTR 9X3 FST ST WHT (CAST SUPPLIES)
CHLORAPREP W/TINT 26 (MISCELLANEOUS) ×2 IMPLANT
CORD BIPOLAR FORCEPS 12FT (ELECTRODE) ×2 IMPLANT
COVER BACK TABLE 60X90IN (DRAPES) ×2 IMPLANT
COVER MAYO STAND STRL (DRAPES) ×2 IMPLANT
CUFF TOURN SGL QUICK 18X4 (TOURNIQUET CUFF) ×2 IMPLANT
DRAPE EXTREMITY T 121X128X90 (DISPOSABLE) ×2 IMPLANT
DRAPE SURG 17X23 STRL (DRAPES) ×2 IMPLANT
GAUZE SPONGE 4X4 12PLY STRL (GAUZE/BANDAGES/DRESSINGS) ×2 IMPLANT
GAUZE STRETCH 2X75IN STRL (MISCELLANEOUS) IMPLANT
GAUZE XEROFORM 1X8 LF (GAUZE/BANDAGES/DRESSINGS) ×2 IMPLANT
GLOVE BIO SURGEON STRL SZ7.5 (GLOVE) ×2 IMPLANT
GLOVE BIOGEL PI IND STRL 8 (GLOVE) ×1 IMPLANT
GLOVE BIOGEL PI INDICATOR 8 (GLOVE) ×1
GOWN STRL REUS W/ TWL LRG LVL3 (GOWN DISPOSABLE) ×1 IMPLANT
GOWN STRL REUS W/TWL LRG LVL3 (GOWN DISPOSABLE) ×2
GOWN STRL REUS W/TWL XL LVL3 (GOWN DISPOSABLE) ×2 IMPLANT
NDL HYPO 25X1 1.5 SAFETY (NEEDLE) ×1 IMPLANT
NEEDLE HYPO 25X1 1.5 SAFETY (NEEDLE) ×2 IMPLANT
NS IRRIG 1000ML POUR BTL (IV SOLUTION) ×2 IMPLANT
PACK BASIN DAY SURGERY FS (CUSTOM PROCEDURE TRAY) ×2 IMPLANT
PAD CAST 3X4 CTTN HI CHSV (CAST SUPPLIES) IMPLANT
PAD CAST 4YDX4 CTTN HI CHSV (CAST SUPPLIES) IMPLANT
PADDING CAST ABS 4INX4YD NS (CAST SUPPLIES) ×1
PADDING CAST ABS COTTON 4X4 ST (CAST SUPPLIES) ×1 IMPLANT
PADDING CAST COTTON 3X4 STRL (CAST SUPPLIES)
PADDING CAST COTTON 4X4 STRL (CAST SUPPLIES)
STOCKINETTE 4X48 STRL (DRAPES) ×2 IMPLANT
STRIP CLOSURE SKIN 1/2X4 (GAUZE/BANDAGES/DRESSINGS) IMPLANT
SUT ETHILON 3 0 PS 1 (SUTURE) IMPLANT
SUT ETHILON 4 0 PS 2 18 (SUTURE) ×2 IMPLANT
SYR BULB EAR ULCER 3OZ GRN STR (SYRINGE) ×2 IMPLANT
SYR CONTROL 10ML LL (SYRINGE) ×2 IMPLANT
TOWEL GREEN STERILE FF (TOWEL DISPOSABLE) ×4 IMPLANT
UNDERPAD 30X36 HEAVY ABSORB (UNDERPADS AND DIAPERS) ×2 IMPLANT

## 2022-01-07 NOTE — Anesthesia Preprocedure Evaluation (Addendum)
Anesthesia Evaluation  Patient identified by MRN, date of birth, ID band Patient awake    Reviewed: Allergy & Precautions, NPO status , Patient's Chart, lab work & pertinent test results, reviewed documented beta blocker date and time   History of Anesthesia Complications Negative for: history of anesthetic complications  Airway Mallampati: II  TM Distance: >3 FB Neck ROM: Full    Dental  (+) Chipped, Dental Advisory Given   Pulmonary neg pulmonary ROS,    breath sounds clear to auscultation       Cardiovascular hypertension, Pt. on medications and Pt. on home beta blockers (-) angina Rhythm:Regular Rate:Normal     Neuro/Psych negative neurological ROS  negative psych ROS   GI/Hepatic negative GI ROS, Neg liver ROS,   Endo/Other  negative endocrine ROS  Renal/GU negative Renal ROS     Musculoskeletal   Abdominal   Peds  Hematology negative hematology ROS (+)   Anesthesia Other Findings   Reproductive/Obstetrics                            Anesthesia Physical Anesthesia Plan  ASA: 2  Anesthesia Plan: MAC and Bier Block and Bier Block-LIDOCAINE ONLY   Post-op Pain Management: Tylenol PO (pre-op)* and Minimal or no pain anticipated   Induction:   PONV Risk Score and Plan: 1 and Ondansetron and Treatment may vary due to age or medical condition  Airway Management Planned: Natural Airway and Simple Face Mask  Additional Equipment: None  Intra-op Plan:   Post-operative Plan:   Informed Consent: I have reviewed the patients History and Physical, chart, labs and discussed the procedure including the risks, benefits and alternatives for the proposed anesthesia with the patient or authorized representative who has indicated his/her understanding and acceptance.     Dental advisory given  Plan Discussed with: CRNA and Surgeon  Anesthesia Plan Comments:        Anesthesia Quick  Evaluation

## 2022-01-07 NOTE — Anesthesia Procedure Notes (Signed)
Anesthesia Regional Block: Bier block (IV Regional)   Pre-Anesthetic Checklist: , timeout performed,  Correct Patient, Correct Site, Correct Laterality,  Correct Procedure, Correct Position, site marked,  Risks and benefits discussed,  Surgical consent,  Pre-op evaluation,  At surgeon's request  Laterality: Left         Needles:  Injection technique: Single-shot  Needle Type: Other      Needle Gauge: 20     Additional Needles:   Procedures:,,,,, intact distal pulses, Esmarch exsanguination,  Single tourniquet utilized    Narrative:   Performed by: [employer]

## 2022-01-07 NOTE — Discharge Instructions (Addendum)

## 2022-01-07 NOTE — Op Note (Signed)
NAME: Jim Mcknight MEDICAL RECORD NO: [id number] DATE OF BIRTH: 29-Mar-1951 FACILITY: [facility] LOCATION: [facility] PHYSICIAN: Tennis Must, MD   OPERATIVE REPORT   DATE OF PROCEDURE: 01/07/22    PREOPERATIVE DIAGNOSIS: Left thumb mass   POSTOPERATIVE DIAGNOSIS: Left thumb mass   PROCEDURE: Excision mass left thumb, subfascial, 11 mm   SURGEON:  Leanora Cover, M.D.   ASSISTANT: none   ANESTHESIA:  Bier block with sedation   INTRAVENOUS FLUIDS:  Per anesthesia flow sheet.   ESTIMATED BLOOD LOSS:  Minimal.   COMPLICATIONS:  None.   SPECIMENS: Left thumb mass to pathology   TOURNIQUET TIME:    Total Tourniquet Time Documented: Forearm (Left) - 28 minutes Total: Forearm (Left) - 28 minutes    DISPOSITION:  Stable to PACU.   INDICATIONS: 71 year old male with massive left thumb.  It is bothersome to him.  He wishes to have it removed.  Risks, benefits and alternatives of surgery were discussed including the risks of blood loss, infection, damage to nerves, vessels, tendons, ligaments, bone for surgery, need for additional surgery, complications with wound healing, continued pain, stiffness, , recurrence.  He voiced understanding of these risks and elected to proceed.  OPERATIVE COURSE:  After being identified preoperatively by myself,  the patient and I agreed on the procedure and site of the procedure.  The surgical site was marked.  Surgical consent had been signed. Preoperative IV antibiotic prophylaxis was given. He was transferred to the operating room and placed on the operating table in supine position with the left upper extremity on an arm board.  Bier block anesthesia was induced by the anesthesiologist.  Left upper extremity was prepped and draped in normal sterile orthopedic fashion.  A surgical pause was performed between the surgeons, anesthesia, and operating room staff and all were in agreement as to the patient, procedure, and site of  procedure.  Tourniquet at the proximal aspect of the forearm had been inflated for the Bier block.  A digital block was performed with quarter percent plain Marcaine to aid in postoperative analgesia.  Incision was made in a hockey-stick shape over the mass at the dorsal ulnar aspect of the thumb.  This was carried in subcutaneous tissues by spreading technique.  Bipolar electrocautery was used to obtain hemostasis.  Mass was easily identified.  It was adherent to surrounding tissues.  Was carefully freed up.  It was solid.  It coursed down deep to the fascia along the ulnar side of the thumb toward the ulnar digital nerve.  Once it was freed up from all soft tissue attachments it was able to be removed.  It measured 11 mm in diameter.  Was sent to pathology for examination.  The ulnar digital nerve was identified and was intact.  The wound was copiously irrigated with sterile saline and closed with 4-0 nylon in a horizontal mattress fashion.  It was dressed with sterile Xeroform 4 x 4 and wrapped with a Coban dressing lightly.  An AlumaFoam splint was placed and wrapped lightly with Coban dressing.  The tourniquet was deflated at 28 minutes.  Fingertips were pink with brisk capillary refill after deflation of tourniquet.  The operative  drapes were broken down.  The patient was awoken from anesthesia safely.  He was transferred back to the stretcher and taken to PACU in stable condition.  I will see him back in the office in 1 week for postoperative followup.  I will give him a prescription for Norco  5/325 1-2 tabs PO q6 hours prn pain, dispense # 15.   Leanora Cover, MD Electronically signed, 01/07/22

## 2022-01-07 NOTE — Anesthesia Procedure Notes (Signed)
Procedure Name: MAC Date/Time: 01/07/2022 3:34 PM  Performed by: Signe Colt, CRNAPre-anesthesia Checklist: Patient identified, Emergency Drugs available, Suction available, Patient being monitored and Timeout performed Patient Re-evaluated:Patient Re-evaluated prior to induction Oxygen Delivery Method: Simple face mask

## 2022-01-07 NOTE — Transfer of Care (Signed)
Immediate Anesthesia Transfer of Care Note  Patient: Jim Mcknight  Procedure(s) Performed: LEFT THUMB MASS EXCISION (Left: Thumb) DEBRIDEMENT INTERPHALANGEAL JOINT (Left: Thumb)  Patient Location: PACU  Anesthesia Type:Bier block  Level of Consciousness: awake, alert , oriented and patient cooperative  Airway & Oxygen Therapy: Patient Spontanous Breathing and Patient connected to face mask oxygen  Post-op Assessment: Report given to RN and Post -op Vital signs reviewed and stable  Post vital signs: Reviewed and stable  Last Vitals:  Vitals Value Taken Time  BP    Temp    Pulse 57 01/07/22 1556  Resp 11 01/07/22 1556  SpO2 99 % 01/07/22 1556  Vitals shown include unvalidated device data.  Last Pain:  Vitals:   01/07/22 1335  TempSrc: Oral  PainSc: 0-No pain         Complications: No notable events documented.

## 2022-01-07 NOTE — H&P (Signed)
  Jim Mcknight is an 71 y.o. male.   Chief Complaint: mass HPI: 71 yo male with mass left thumb.  It is bothersome to him.  He wishes to have it removed and possible ip joint debridement to try to prevent recurrence.  Allergies: No Known Allergies  Past Medical History:  Diagnosis Date   Elevated PSA    Hypertension    Wears glasses     Past Surgical History:  Procedure Laterality Date   ANAL FISSURE REPAIR  1978   PROSTATE BIOPSY N/A 08/22/2016   Procedure: BIOPSY TRANSRECTAL ULTRASONIC PROSTATE (TUBP);  Surgeon: Carolan Clines, MD;  Location: [facility];  Service: Urology;  Laterality: N/A;   PROSTATE BIOPSY N/A 01/27/2020   Procedure: BIOPSY TRANSRECTAL ULTRASONIC PROSTATE (TUBP);  Surgeon: Raynelle Bring, MD;  Location: [facility];  Service: Urology;  Laterality: N/A;    Family History: History reviewed. No pertinent family history.  Social History:   reports that he has never smoked. He has never used smokeless tobacco. He reports that he does not drink alcohol and does not use drugs.  Medications: Medications Prior to Admission  Medication Sig Dispense Refill   atorvastatin (LIPITOR) 10 MG tablet Take 10 mg by mouth daily.     Cholecalciferol (VITAMIN D-3) 1000 units CAPS Take 1,000 Units by mouth daily.      folic acid (FOLVITE) 500 MCG tablet Take 400 mcg by mouth daily.     lisinopril-hydrochlorothiazide (PRINZIDE,ZESTORETIC) 20-12.5 MG tablet Take 1 tablet by mouth every morning.     metoprolol succinate (TOPROL-XL) 50 MG 24 hr tablet Take 50 mg by mouth daily.     quinapril (ACCUPRIL) 5 MG tablet Take 5 mg by mouth every morning.     vitamin C (ASCORBIC ACID) 500 MG tablet Take 500 mg by mouth daily.      No results found for this or any previous visit (from the past 48 hour(s)).  No results found.    Height '6\' 1"'$  (1.854 m), weight 90.7 kg.  General appearance: alert, cooperative, and appears stated age Head: Normocephalic, without  obvious abnormality, atraumatic Neck: supple, symmetrical, trachea midline Extremities: Intact sensation and capillary refill all digits.  +epl/fpl/io.  No wounds.  Pulses: 2+ and symmetric Skin: Skin color, texture, turgor normal. No rashes or lesions Neurologic: Grossly normal Incision/Wound: none  Assessment/Plan Left thumb mass.  Plan excision with possible ip joint debridement.  Non operative and operative treatment options have been discussed with the patient and patient wishes to proceed with operative treatment. Risks, benefits, and alternatives of surgery have been discussed and the patient agrees with the plan of care.    Leanora Cover 01/07/2022, 1:24 PM

## 2022-01-07 NOTE — Anesthesia Postprocedure Evaluation (Signed)
Anesthesia Post Note  Patient: Jim Mcknight  Procedure(s) Performed: LEFT THUMB MASS EXCISION (Left: Thumb) DEBRIDEMENT INTERPHALANGEAL JOINT (Left: Thumb)     Patient location during evaluation: PACU Anesthesia Type: MAC Level of consciousness: awake and alert, patient cooperative and oriented Pain management: pain level controlled Vital Signs Assessment: post-procedure vital signs reviewed and stable Respiratory status: spontaneous breathing, nonlabored ventilation and respiratory function stable Cardiovascular status: stable and blood pressure returned to baseline Postop Assessment: no apparent nausea or vomiting and adequate PO intake Anesthetic complications: no   No notable events documented.  Last Vitals:  Vitals:   01/07/22 1600 01/07/22 1615  BP: 138/75 (!) 173/96  Pulse: (!) 56 (!) 58  Resp: 11 16  Temp:    SpO2: 99% 97%    Last Pain:  Vitals:   01/07/22 1615  TempSrc:   PainSc: 0-No pain                 Devontaye Ground,E. Raylyn Speckman

## 2022-01-08 ENCOUNTER — Encounter (HOSPITAL_BASED_OUTPATIENT_CLINIC_OR_DEPARTMENT_OTHER): Payer: Self-pay | Admitting: Orthopedic Surgery

## 2022-01-09 LAB — SURGICAL PATHOLOGY

## 2022-01-11 ENCOUNTER — Encounter (HOSPITAL_BASED_OUTPATIENT_CLINIC_OR_DEPARTMENT_OTHER): Payer: Self-pay

## 2022-01-11 ENCOUNTER — Ambulatory Visit (HOSPITAL_BASED_OUTPATIENT_CLINIC_OR_DEPARTMENT_OTHER): Admit: 2022-01-11 | Payer: Medicare Other | Admitting: Orthopedic Surgery

## 2022-01-11 SURGERY — EXCISION, GANGLION CYST, WRIST
Anesthesia: LOCAL | Site: Wrist | Laterality: Left

## 2022-02-05 DIAGNOSIS — Z85820 Personal history of malignant melanoma of skin: Secondary | ICD-10-CM | POA: Diagnosis not present

## 2022-02-05 DIAGNOSIS — C44329 Squamous cell carcinoma of skin of other parts of face: Secondary | ICD-10-CM | POA: Diagnosis not present

## 2022-02-05 DIAGNOSIS — Z85828 Personal history of other malignant neoplasm of skin: Secondary | ICD-10-CM | POA: Diagnosis not present

## 2022-03-11 DIAGNOSIS — L812 Freckles: Secondary | ICD-10-CM | POA: Diagnosis not present

## 2022-03-11 DIAGNOSIS — D0462 Carcinoma in situ of skin of left upper limb, including shoulder: Secondary | ICD-10-CM | POA: Diagnosis not present

## 2022-03-11 DIAGNOSIS — L570 Actinic keratosis: Secondary | ICD-10-CM | POA: Diagnosis not present

## 2022-03-11 DIAGNOSIS — D485 Neoplasm of uncertain behavior of skin: Secondary | ICD-10-CM | POA: Diagnosis not present

## 2022-03-11 DIAGNOSIS — L821 Other seborrheic keratosis: Secondary | ICD-10-CM | POA: Diagnosis not present

## 2022-03-11 DIAGNOSIS — Z85820 Personal history of malignant melanoma of skin: Secondary | ICD-10-CM | POA: Diagnosis not present

## 2022-03-11 DIAGNOSIS — Z85828 Personal history of other malignant neoplasm of skin: Secondary | ICD-10-CM | POA: Diagnosis not present

## 2022-03-11 DIAGNOSIS — D2271 Melanocytic nevi of right lower limb, including hip: Secondary | ICD-10-CM | POA: Diagnosis not present

## 2022-03-11 DIAGNOSIS — D225 Melanocytic nevi of trunk: Secondary | ICD-10-CM | POA: Diagnosis not present

## 2022-03-11 DIAGNOSIS — D2272 Melanocytic nevi of left lower limb, including hip: Secondary | ICD-10-CM | POA: Diagnosis not present

## 2022-05-01 ENCOUNTER — Other Ambulatory Visit: Payer: Self-pay | Admitting: Urology

## 2022-05-01 DIAGNOSIS — C61 Malignant neoplasm of prostate: Secondary | ICD-10-CM

## 2022-07-11 ENCOUNTER — Other Ambulatory Visit: Payer: No Typology Code available for payment source

## 2022-07-21 ENCOUNTER — Ambulatory Visit
Admission: RE | Admit: 2022-07-21 | Discharge: 2022-07-21 | Disposition: A | Discharge status: Home / Self Care | Payer: No Typology Code available for payment source | Source: Ambulatory Visit | Attending: Urology | Admitting: Urology

## 2022-07-21 DIAGNOSIS — C61 Malignant neoplasm of prostate: Secondary | ICD-10-CM

## 2022-07-21 DIAGNOSIS — R972 Elevated prostate specific antigen [PSA]: Secondary | ICD-10-CM | POA: Diagnosis not present

## 2022-07-21 MED ORDER — GADOPICLENOL 0.5 MMOL/ML IV SOLN
10.0000 mL | Freq: Once | INTRAVENOUS | Status: AC | PRN
  Administered 2022-07-21: 10 mL via INTRAVENOUS

## 2022-07-25 DIAGNOSIS — C61 Malignant neoplasm of prostate: Secondary | ICD-10-CM | POA: Diagnosis not present

## 2022-08-02 DIAGNOSIS — K4020 Bilateral inguinal hernia, without obstruction or gangrene, not specified as recurrent: Secondary | ICD-10-CM | POA: Diagnosis not present

## 2022-08-02 DIAGNOSIS — C61 Malignant neoplasm of prostate: Secondary | ICD-10-CM | POA: Diagnosis not present

## 2022-08-07 DIAGNOSIS — I10 Essential (primary) hypertension: Secondary | ICD-10-CM | POA: Diagnosis not present

## 2022-08-07 DIAGNOSIS — M25551 Pain in right hip: Secondary | ICD-10-CM | POA: Diagnosis not present

## 2022-08-07 DIAGNOSIS — K4090 Unilateral inguinal hernia, without obstruction or gangrene, not specified as recurrent: Secondary | ICD-10-CM | POA: Diagnosis not present

## 2022-08-07 DIAGNOSIS — Z6827 Body mass index (BMI) 27.0-27.9, adult: Secondary | ICD-10-CM | POA: Diagnosis not present

## 2022-09-04 DIAGNOSIS — K4020 Bilateral inguinal hernia, without obstruction or gangrene, not specified as recurrent: Secondary | ICD-10-CM | POA: Diagnosis not present

## 2022-09-18 ENCOUNTER — Ambulatory Visit: Payer: Self-pay | Admitting: General Surgery

## 2022-09-23 DIAGNOSIS — D225 Melanocytic nevi of trunk: Secondary | ICD-10-CM | POA: Diagnosis not present

## 2022-09-23 DIAGNOSIS — D2261 Melanocytic nevi of right upper limb, including shoulder: Secondary | ICD-10-CM | POA: Diagnosis not present

## 2022-09-23 DIAGNOSIS — D485 Neoplasm of uncertain behavior of skin: Secondary | ICD-10-CM | POA: Diagnosis not present

## 2022-09-23 DIAGNOSIS — D044 Carcinoma in situ of skin of scalp and neck: Secondary | ICD-10-CM | POA: Diagnosis not present

## 2022-09-23 DIAGNOSIS — L738 Other specified follicular disorders: Secondary | ICD-10-CM | POA: Diagnosis not present

## 2022-09-23 DIAGNOSIS — D2262 Melanocytic nevi of left upper limb, including shoulder: Secondary | ICD-10-CM | POA: Diagnosis not present

## 2022-09-23 DIAGNOSIS — L821 Other seborrheic keratosis: Secondary | ICD-10-CM | POA: Diagnosis not present

## 2022-09-23 DIAGNOSIS — L820 Inflamed seborrheic keratosis: Secondary | ICD-10-CM | POA: Diagnosis not present

## 2022-09-23 DIAGNOSIS — L812 Freckles: Secondary | ICD-10-CM | POA: Diagnosis not present

## 2022-09-23 DIAGNOSIS — L570 Actinic keratosis: Secondary | ICD-10-CM | POA: Diagnosis not present

## 2022-09-23 DIAGNOSIS — D2271 Melanocytic nevi of right lower limb, including hip: Secondary | ICD-10-CM | POA: Diagnosis not present

## 2022-09-23 DIAGNOSIS — D2272 Melanocytic nevi of left lower limb, including hip: Secondary | ICD-10-CM | POA: Diagnosis not present

## 2022-10-02 NOTE — Progress Notes (Signed)
COVID Vaccine received:  []  No [x]  Yes Date of any COVID positive Test in last 90 days:  PCP - Kathyrn Lass, MD [facility] [place] Cardiologist - none  Chest x-ray -  EKG - 12-25-2021  Epic  Stress Test -  ECHO -  Cardiac Cath -   PCR screen: []  Ordered & Completed                      []   No Order but Needs PROFEND                      [x]   N/A for this surgery  Surgery Plan:  [x]  Ambulatory                            []  Outpatient in bed                            []  Admit  Anesthesia:    [x]  General  []  Spinal                           []   Choice []   MAC  Bowel Prep - []  No  []   Yes ______  Pacemaker / ICD device [x]  No []  Yes   Spinal Cord Stimulator:[x]  No []  Yes      History of Sleep Apnea? [x]  No []  Yes   CPAP used?- [x]  No []  Yes    Does the patient monitor blood sugar? []  No []  Yes  [x]  N/A     No DM  Blood Thinner / Instructions:  None Aspirin Instructions:  ASA 81 mg    Hold x 7 days.  Last dose: Wednesday  10-09-22  ERAS Protocol Ordered: []  No  [x]  Yes PRE-SURGERY [x]  ENSURE  []  G2  Patient is to be NPO after:  04:30 am   Comments:   Activity level: Patient is able / unable to climb a flight of stairs without difficulty; []  No CP  []  No SOB, but would have ___   Patient can / can not perform ADLs without assistance.   Anesthesia review: HTN, no other pertinent hx  Patient denies shortness of breath, fever, cough and chest pain at PAT appointment.  Patient verbalized understanding and agreement to the Pre-Surgical Instructions that were given to them at this PAT appointment. Patient was also educated of the need to review these PAT instructions again prior to his surgery.I reviewed the appropriate phone numbers to call if they have any and questions or concerns.

## 2022-10-02 NOTE — Patient Instructions (Signed)
SURGICAL WAITING ROOM VISITATION Patients having surgery or a procedure may have no more than 2 support people in the waiting area - these visitors may rotate in the visitor waiting room.   Due to an increase in RSV and influenza rates and associated hospitalizations, children ages 68 and under may not visit patients in [place]. If the patient needs to stay at the hospital during part of their recovery, the visitor guidelines for inpatient rooms apply.  PRE-OP VISITATION  Pre-op nurse will coordinate an appropriate time for 1 support person to accompany the patient in pre-op.  This support person may not rotate.  This visitor will be contacted when the time is appropriate for the visitor to come back in the pre-op area.  Please refer to the [facility] website for the visitor guidelines for Inpatients (after your surgery is over and you are in a regular room).  You are not required to quarantine at this time prior to your surgery. However, you must do this: Hand Hygiene often Do NOT share personal items Notify your provider if you are in close contact with someone who has COVID or you develop fever 100.4 or greater, new onset of sneezing, cough, sore throat, shortness of breath or body aches.  If you test positive for Covid or have been in contact with anyone that has tested positive in the last 10 days please notify you surgeon.    Your procedure is scheduled on:  Thursday   October 17, 2022  Report to [facility] Main Entrance: [place] entrance where the [employer] is available.   Report to admitting at: 05:15  AM  +++++Call this number if you have any questions or problems the morning of surgery [phone]  Do not eat food after Midnight the night prior to your surgery/procedure.  After Midnight you may have the following liquids until 04:30  AM DAY OF SURGERY  Clear Liquid Diet Water Black Coffee (sugar ok, NO MILK/CREAM OR CREAMERS)  Tea (sugar ok, NO  MILK/CREAM OR CREAMERS) regular and decaf                             Plain Jell-O  with no fruit (NO RED)                                           Fruit ices (not with fruit pulp, NO RED)                                     Popsicles (NO RED)                                                                  Juice: apple, WHITE grape, WHITE cranberry Sports drinks like Gatorade or Powerade (NO RED)                    The day of surgery:  Drink ONE (1) Pre-Surgery Clear Ensure at  04:30  AM the morning of surgery. Drink in one sitting.  Do not sip.  This drink was given to you during your hospital pre-op appointment visit. Nothing else to drink after completing the Pre-Surgery Clear Ensure : No candy, chewing gum or throat lozenges.    FOLLOW BOWEL PREP AND ANY ADDITIONAL PRE OP INSTRUCTIONS YOU RECEIVED FROM YOUR SURGEON'S OFFICE!!!   Oral Hygiene is also important to reduce your risk of infection.        Remember - BRUSH YOUR TEETH THE MORNING OF SURGERY WITH YOUR REGULAR TOOTHPASTE  Do NOT smoke after Midnight the night before surgery.  Take ONLY these medicines the morning of surgery with A SIP OF WATER: Metoprolol                     You may not have any metal on your body including jewelry, and body piercing  Do not wear  lotions, powders, cologne, or deodorant  Men may shave face and neck.  Contacts, Hearing Aids, dentures or bridgework may not be worn into surgery. DENTURES WILL BE REMOVED PRIOR TO SURGERY PLEASE DO NOT APPLY "Poly grip" OR ADHESIVES!!!  Patients discharged on the day of surgery will not be allowed to drive home.  Someone NEEDS to stay with you for the first 24 hours after anesthesia.  Do not bring your home medications to the hospital. The Pharmacy will dispense medications listed on your medication list to you during your admission in the Hospital.  Special Instructions: Bring a copy of your healthcare power of attorney and living will documents the  day of surgery, if you wish to have them scanned into your [facility] Records- EPIC  Please read over the following fact sheets you were given: IF YOU HAVE QUESTIONS ABOUT YOUR [place], [place] [phone].   [place] - Preparing for Surgery Before surgery, you can play an important role.  Because skin is not sterile, your skin needs to be as free of germs as possible.  You can reduce the number of germs on your skin by washing with CHG (chlorahexidine gluconate) soap before surgery.  CHG is an antiseptic cleaner which kills germs and bonds with the skin to continue killing germs even after washing. Please DO NOT use if you have an allergy to CHG or antibacterial soaps.  If your skin becomes reddened/irritated stop using the CHG and inform your nurse when you arrive at Short Stay. Do not shave (including legs and underarms) for at least 48 hours prior to the first CHG shower.  You may shave your face/neck.  Please follow these instructions carefully:  1.  Shower with CHG Soap the night before surgery and the  morning of surgery.  2.  If you choose to wash your hair, wash your hair first as usual with your normal  shampoo.  3.  After you shampoo, rinse your hair and body thoroughly to remove the shampoo.                             4.  Use CHG as you would any other liquid soap.  You can apply chg directly to the skin and wash.  Gently with a scrungie or clean washcloth.  5.  Apply the CHG Soap to your body ONLY FROM THE NECK DOWN.   Do not use on face/ open  Wound or open sores. Avoid contact with eyes, ears mouth and genitals (private parts).                       Wash face,  Genitals (private parts) with your normal soap.             6.  Wash thoroughly, paying special attention to the area where your  surgery  will be performed.  7.  Thoroughly rinse your body with warm water from the neck down.  8.  DO NOT shower/wash with your normal soap  after using and rinsing off the CHG Soap.            9.  Pat yourself dry with a clean towel.            10.  Wear clean pajamas.            11.  Place clean sheets on your bed the night of your first shower and do not  sleep with pets.  ON THE DAY OF SURGERY : Do not apply any lotions/deodorants the morning of surgery.  Please wear clean clothes to the hospital/surgery center.    FAILURE TO FOLLOW THESE INSTRUCTIONS MAY RESULT IN THE CANCELLATION OF YOUR SURGERY  PATIENT SIGNATURE_________________________________  NURSE SIGNATURE__________________________________  ________________________________________________________________________       Jim Mcknight    An incentive spirometer is a tool that can help keep your lungs clear and active. This tool measures how well you are filling your lungs with each breath. Taking long deep breaths may help reverse or decrease the chance of developing breathing (pulmonary) problems (especially infection) following: A long period of time when you are unable to move or be active. BEFORE THE PROCEDURE  If the spirometer includes an indicator to show your best effort, your nurse or respiratory therapist will set it to a desired goal. If possible, sit up straight or lean slightly forward. Try not to slouch. Hold the incentive spirometer in an upright position. INSTRUCTIONS FOR USE  Sit on the edge of your bed if possible, or sit up as far as you can in bed or on a chair. Hold the incentive spirometer in an upright position. Breathe out normally. Place the mouthpiece in your mouth and seal your lips tightly around it. Breathe in slowly and as deeply as possible, raising the piston or the ball toward the top of the column. Hold your breath for 3-5 seconds or for as long as possible. Allow the piston or ball to fall to the bottom of the column. Remove the mouthpiece from your mouth and breathe out normally. Rest for a few seconds and repeat  Steps 1 through 7 at least 10 times every 1-2 hours when you are awake. Take your time and take a few normal breaths between deep breaths. The spirometer may include an indicator to show your best effort. Use the indicator as a goal to work toward during each repetition. After each set of 10 deep breaths, practice coughing to be sure your lungs are clear. If you have an incision (the cut made at the time of surgery), support your incision when coughing by placing a pillow or rolled up towels firmly against it. Once you are able to get out of bed, walk around indoors and cough well. You may stop using the incentive spirometer when instructed by your caregiver.  RISKS AND COMPLICATIONS Take your time so you do not get dizzy or light-headed. If you are  in pain, you may need to take or ask for pain medication before doing incentive spirometry. It is harder to take a deep breath if you are having pain. AFTER USE Rest and breathe slowly and easily. It can be helpful to keep track of a log of your progress. Your caregiver can provide you with a simple table to help with this. If you are using the spirometer at home, follow these instructions: [place] IF:  You are having difficultly using the spirometer. You have trouble using the spirometer as often as instructed. Your pain medication is not giving enough relief while using the spirometer. You develop fever of 100.5 F (38.1 C) or higher.                                                                                                    SEEK IMMEDIATE MEDICAL CARE IF:  You cough up bloody sputum that had not been present before. You develop fever of 102 F (38.9 C) or greater. You develop worsening pain at or near the incision site. MAKE SURE YOU:  Understand these instructions. Will watch your condition. Will get help right away if you are not doing well or get worse. Document Released: 10/28/2006 Document Revised: 09/09/2011 Document  Reviewed: 12/29/2006 [facility] Patient Information 2014 [place], [place].

## 2022-10-03 ENCOUNTER — Encounter (HOSPITAL_COMMUNITY)
Admission: RE | Admit: 2022-10-03 | Discharge: 2022-10-03 | Disposition: A | Discharge status: Home / Self Care | Payer: No Typology Code available for payment source | Source: Ambulatory Visit | Attending: General Surgery | Admitting: General Surgery

## 2022-10-03 ENCOUNTER — Encounter (HOSPITAL_COMMUNITY): Payer: Self-pay

## 2022-10-03 ENCOUNTER — Other Ambulatory Visit: Payer: Self-pay

## 2022-10-03 VITALS — BP 137/79 | HR 74 | Temp 97.9°F | Resp 16 | Ht 73.0 in | Wt 197.0 lb

## 2022-10-03 DIAGNOSIS — Z01812 Encounter for preprocedural laboratory examination: Secondary | ICD-10-CM | POA: Diagnosis not present

## 2022-10-03 DIAGNOSIS — I10 Essential (primary) hypertension: Secondary | ICD-10-CM

## 2022-10-03 HISTORY — DX: Unspecified osteoarthritis, unspecified site: M19.90

## 2022-10-03 HISTORY — DX: Malignant (primary) neoplasm, unspecified: C80.1

## 2022-10-03 LAB — BASIC METABOLIC PANEL
Anion gap: 8 (ref 5–15)
BUN: 12 mg/dL (ref 8–23)
CO2: 26 mmol/L (ref 22–32)
Calcium: 8.9 mg/dL (ref 8.9–10.3)
Chloride: 103 mmol/L (ref 98–111)
Creatinine, Ser: 1.14 mg/dL (ref 0.61–1.24)
GFR, Estimated: >60 mL/min (ref >60)
Glucose, Bld: 94 mg/dL (ref 70–99)
Potassium: 3.7 mmol/L (ref 3.5–5.1)
Sodium: 137 mmol/L (ref 135–145)

## 2022-10-03 LAB — CBC
HCT: 41.2 % (ref 39.0–52.0)
Hemoglobin: 13.1 g/dL (ref 13.0–17.0)
MCH: 26.9 pg (ref 26.0–34.0)
MCHC: 31.8 g/dL (ref 30.0–36.0)
MCV: 84.6 fL (ref 80.0–100.0)
Platelets: 318 10*3/uL (ref 150–400)
RBC: 4.87 MIL/uL (ref 4.22–5.81)
RDW: 15.4 % (ref 11.5–15.5)
WBC: 8.9 10*3/uL (ref 4.0–10.5)
nRBC: 0 % (ref 0.0–0.2)

## 2022-10-16 ENCOUNTER — Encounter (HOSPITAL_COMMUNITY): Payer: Self-pay | Admitting: General Surgery

## 2022-10-16 NOTE — Anesthesia Preprocedure Evaluation (Signed)
Anesthesia Evaluation  Patient identified by MRN, date of birth, ID band Patient awake    Reviewed: Allergy & Precautions, NPO status , Patient's Chart, lab work & pertinent test results, reviewed documented beta blocker date and time   Airway Mallampati: II  TM Distance: >3 FB Neck ROM: Full    Dental  (+) Chipped, Dental Advisory Given   Pulmonary neg pulmonary ROS   Pulmonary exam normal breath sounds clear to auscultation       Cardiovascular hypertension, Pt. on medications and Pt. on home beta blockers Normal cardiovascular exam Rhythm:Regular Rate:Normal     Neuro/Psych negative neurological ROS  negative psych ROS   GI/Hepatic negative GI ROS, Neg liver ROS,,,  Endo/Other  Hyperlipidemia  Renal/GU negative Renal ROS  negative genitourinary   Musculoskeletal  (+) Arthritis , Osteoarthritis,  Hx/o melanoma   Abdominal   Peds  Hematology negative hematology ROS (+)   Anesthesia Other Findings   Reproductive/Obstetrics                              Anesthesia Physical Anesthesia Plan  ASA: 2  Anesthesia Plan: General   Post-op Pain Management: Dilaudid IV, Precedex and Tylenol PO (pre-op)*   Induction: Intravenous  PONV Risk Score and Plan: 4 or greater and Treatment may vary due to age or medical condition  Airway Management Planned: Oral ETT  Additional Equipment: None  Intra-op Plan:   Post-operative Plan: Extubation in OR  Informed Consent: I have reviewed the patients History and Physical, chart, labs and discussed the procedure including the risks, benefits and alternatives for the proposed anesthesia with the patient or authorized representative who has indicated his/her understanding and acceptance.     Dental advisory given  Plan Discussed with: Anesthesiologist and CRNA  Anesthesia Plan Comments:          Anesthesia Quick Evaluation

## 2022-10-17 ENCOUNTER — Encounter (HOSPITAL_COMMUNITY): Payer: Self-pay | Admitting: General Surgery

## 2022-10-17 ENCOUNTER — Encounter (HOSPITAL_COMMUNITY)
Admission: RE | Disposition: A | Discharge status: Home / Self Care | Payer: Self-pay | Source: Ambulatory Visit | Attending: General Surgery

## 2022-10-17 ENCOUNTER — Ambulatory Visit (HOSPITAL_COMMUNITY)
Admission: RE | Admit: 2022-10-17 | Discharge: 2022-10-17 | Disposition: A | Discharge status: Home / Self Care | Payer: No Typology Code available for payment source | Source: Ambulatory Visit | Attending: General Surgery | Admitting: General Surgery

## 2022-10-17 ENCOUNTER — Ambulatory Visit (HOSPITAL_COMMUNITY): Payer: No Typology Code available for payment source | Admitting: Certified Registered Nurse Anesthetist

## 2022-10-17 ENCOUNTER — Ambulatory Visit (HOSPITAL_BASED_OUTPATIENT_CLINIC_OR_DEPARTMENT_OTHER): Payer: No Typology Code available for payment source | Admitting: Certified Registered Nurse Anesthetist

## 2022-10-17 ENCOUNTER — Other Ambulatory Visit: Payer: Self-pay

## 2022-10-17 DIAGNOSIS — E785 Hyperlipidemia, unspecified: Secondary | ICD-10-CM | POA: Insufficient documentation

## 2022-10-17 DIAGNOSIS — K4020 Bilateral inguinal hernia, without obstruction or gangrene, not specified as recurrent: Secondary | ICD-10-CM | POA: Diagnosis not present

## 2022-10-17 DIAGNOSIS — Z79899 Other long term (current) drug therapy: Secondary | ICD-10-CM | POA: Insufficient documentation

## 2022-10-17 DIAGNOSIS — Z85820 Personal history of malignant melanoma of skin: Secondary | ICD-10-CM | POA: Diagnosis not present

## 2022-10-17 DIAGNOSIS — I10 Essential (primary) hypertension: Secondary | ICD-10-CM | POA: Diagnosis not present

## 2022-10-17 DIAGNOSIS — M1990 Unspecified osteoarthritis, unspecified site: Secondary | ICD-10-CM

## 2022-10-17 HISTORY — PX: XI ROBOTIC ASSISTED INGUINAL HERNIA REPAIR WITH MESH: SHX6706

## 2022-10-17 SURGERY — REPAIR, HERNIA, INGUINAL, ROBOT-ASSISTED, LAPAROSCOPIC, USING MESH
Anesthesia: General | Laterality: Bilateral

## 2022-10-17 MED ORDER — SUGAMMADEX SODIUM 200 MG/2ML IV SOLN
INTRAVENOUS | Status: DC | PRN
  Administered 2022-10-17: 200 mg via INTRAVENOUS

## 2022-10-17 MED ORDER — DEXAMETHASONE SODIUM PHOSPHATE 4 MG/ML IJ SOLN
INTRAMUSCULAR | Status: DC | PRN
  Administered 2022-10-17: 5 mg via INTRAVENOUS

## 2022-10-17 MED ORDER — ROCURONIUM BROMIDE 10 MG/ML (PF) SYRINGE
PREFILLED_SYRINGE | INTRAVENOUS | Status: AC
  Filled 2022-10-17: qty 10

## 2022-10-17 MED ORDER — FENTANYL CITRATE (PF) 100 MCG/2ML IJ SOLN
INTRAMUSCULAR | Status: AC
  Filled 2022-10-17: qty 2

## 2022-10-17 MED ORDER — DEXMEDETOMIDINE HCL IN NACL 80 MCG/20ML IV SOLN
INTRAVENOUS | Status: DC | PRN
  Administered 2022-10-17: 8 ug via BUCCAL

## 2022-10-17 MED ORDER — HYDROMORPHONE HCL 1 MG/ML IJ SOLN: 0.2500 mg | INTRAMUSCULAR | Status: DC | PRN

## 2022-10-17 MED ORDER — LACTATED RINGERS IV SOLN: INTRAVENOUS | Status: DC

## 2022-10-17 MED ORDER — BUPIVACAINE LIPOSOME 1.3 % IJ SUSP
INTRAMUSCULAR | Status: DC | PRN
  Administered 2022-10-17: 20 mL

## 2022-10-17 MED ORDER — ONDANSETRON HCL 4 MG/2ML IJ SOLN
INTRAMUSCULAR | Status: DC | PRN
  Administered 2022-10-17: 4 mg via INTRAVENOUS

## 2022-10-17 MED ORDER — ENSURE PRE-SURGERY PO LIQD
296.0000 mL | Freq: Once | ORAL | Status: DC
  Filled 2022-10-17: qty 296

## 2022-10-17 MED ORDER — ORAL CARE MOUTH RINSE: 15.0000 mL | Freq: Once | OROMUCOSAL | Status: AC

## 2022-10-17 MED ORDER — AMISULPRIDE (ANTIEMETIC) 5 MG/2ML IV SOLN: 10.0000 mg | Freq: Once | INTRAVENOUS | Status: AC | PRN

## 2022-10-17 MED ORDER — LIDOCAINE 2% (20 MG/ML) 5 ML SYRINGE
INTRAMUSCULAR | Status: DC | PRN
  Administered 2022-10-17: 80 mg via INTRAVENOUS

## 2022-10-17 MED ORDER — BUPIVACAINE HCL 0.25 % IJ SOLN
INTRAMUSCULAR | Status: AC
  Filled 2022-10-17: qty 1

## 2022-10-17 MED ORDER — CHLORHEXIDINE GLUCONATE CLOTH 2 % EX PADS: 6.0000 | MEDICATED_PAD | Freq: Once | CUTANEOUS | Status: DC

## 2022-10-17 MED ORDER — BUPIVACAINE LIPOSOME 1.3 % IJ SUSP
INTRAMUSCULAR | Status: AC
  Filled 2022-10-17: qty 20

## 2022-10-17 MED ORDER — ONDANSETRON HCL 4 MG/2ML IJ SOLN
INTRAMUSCULAR | Status: AC
  Filled 2022-10-17: qty 2

## 2022-10-17 MED ORDER — OXYCODONE HCL 5 MG PO TABS
ORAL_TABLET | ORAL | Status: AC
  Filled 2022-10-17: qty 1

## 2022-10-17 MED ORDER — IBUPROFEN 800 MG PO TABS
800.0000 mg | ORAL_TABLET | Freq: Three times a day (TID) | ORAL | 0 refills | Status: AC | PRN
Start: 2022-10-17 — End: ?

## 2022-10-17 MED ORDER — BUPIVACAINE LIPOSOME 1.3 % IJ SUSP: 20.0000 mL | Freq: Once | INTRAMUSCULAR | Status: DC

## 2022-10-17 MED ORDER — EPHEDRINE SULFATE-NACL 50-0.9 MG/10ML-% IV SOSY
PREFILLED_SYRINGE | INTRAVENOUS | Status: DC | PRN
  Administered 2022-10-17: 5 mg via INTRAVENOUS
  Administered 2022-10-17: 7.5 mg via INTRAVENOUS
  Administered 2022-10-17: 5 mg via INTRAVENOUS

## 2022-10-17 MED ORDER — LIDOCAINE HCL 2 % IJ SOLN
INTRAMUSCULAR | Status: AC
  Filled 2022-10-17: qty 20

## 2022-10-17 MED ORDER — OXYCODONE HCL 5 MG/5ML PO SOLN: 5.0000 mg | Freq: Once | ORAL | Status: AC | PRN

## 2022-10-17 MED ORDER — CEFAZOLIN SODIUM-DEXTROSE 2-4 GM/100ML-% IV SOLN
2.0000 g | INTRAVENOUS | Status: AC
  Administered 2022-10-17: 2 g via INTRAVENOUS
  Filled 2022-10-17: qty 100

## 2022-10-17 MED ORDER — LIDOCAINE HCL (PF) 2 % IJ SOLN
INTRAMUSCULAR | Status: DC | PRN
  Administered 2022-10-17: 1.5 mg/kg/h via INTRADERMAL

## 2022-10-17 MED ORDER — OXYCODONE HCL 5 MG PO TABS: 5.0000 mg | ORAL_TABLET | Freq: Four times a day (QID) | ORAL | 0 refills | Status: DC | PRN

## 2022-10-17 MED ORDER — CHLORHEXIDINE GLUCONATE 0.12 % MT SOLN
15.0000 mL | Freq: Once | OROMUCOSAL | Status: AC
  Administered 2022-10-17: 15 mL via OROMUCOSAL

## 2022-10-17 MED ORDER — ONDANSETRON HCL 4 MG/2ML IJ SOLN: 4.0000 mg | Freq: Once | INTRAMUSCULAR | Status: DC | PRN

## 2022-10-17 MED ORDER — AMISULPRIDE (ANTIEMETIC) 5 MG/2ML IV SOLN
INTRAVENOUS | Status: AC
  Administered 2022-10-17: 10 mg via INTRAVENOUS
  Filled 2022-10-17: qty 4

## 2022-10-17 MED ORDER — FENTANYL CITRATE (PF) 100 MCG/2ML IJ SOLN
INTRAMUSCULAR | Status: DC | PRN
  Administered 2022-10-17 (×2): 100 ug via INTRAVENOUS

## 2022-10-17 MED ORDER — OXYCODONE HCL 5 MG PO TABS
5.0000 mg | ORAL_TABLET | Freq: Once | ORAL | Status: AC | PRN
  Administered 2022-10-17: 5 mg via ORAL

## 2022-10-17 MED ORDER — ROCURONIUM BROMIDE 10 MG/ML (PF) SYRINGE
PREFILLED_SYRINGE | INTRAVENOUS | Status: DC | PRN
  Administered 2022-10-17: 80 mg via INTRAVENOUS

## 2022-10-17 MED ORDER — ACETAMINOPHEN 500 MG PO TABS
1000.0000 mg | ORAL_TABLET | ORAL | Status: AC
  Administered 2022-10-17: 1000 mg via ORAL
  Filled 2022-10-17: qty 2

## 2022-10-17 MED ORDER — PROPOFOL 10 MG/ML IV BOLUS
INTRAVENOUS | Status: DC | PRN
  Administered 2022-10-17: 150 mg via INTRAVENOUS

## 2022-10-17 MED ORDER — BUPIVACAINE HCL 0.25 % IJ SOLN
INTRAMUSCULAR | Status: DC | PRN
  Administered 2022-10-17: 30 mL

## 2022-10-17 MED ORDER — PROPOFOL 10 MG/ML IV BOLUS
INTRAVENOUS | Status: AC
  Filled 2022-10-17: qty 20

## 2022-10-17 MED ORDER — DEXAMETHASONE SODIUM PHOSPHATE 10 MG/ML IJ SOLN
INTRAMUSCULAR | Status: AC
  Filled 2022-10-17: qty 1

## 2022-10-17 SURGICAL SUPPLY — 44 items
ADH SKN CLS APL DERMABOND .7 (GAUZE/BANDAGES/DRESSINGS) ×1
APL PRP STRL LF DISP 70% ISPRP (MISCELLANEOUS) ×1
APL SWBSTK 6 STRL LF DISP (MISCELLANEOUS)
APPLICATOR COTTON TIP 6 STRL (MISCELLANEOUS) IMPLANT
APPLICATOR COTTON TIP 6IN STRL (MISCELLANEOUS)
BAG COUNTER SPONGE SURGICOUNT (BAG) IMPLANT
BAG SPNG CNTER NS LX DISP (BAG)
CHLORAPREP W/TINT 26 (MISCELLANEOUS) ×1 IMPLANT
COVER SURGICAL LIGHT HANDLE (MISCELLANEOUS) ×1 IMPLANT
COVER TIP SHEARS 8 DVNC (MISCELLANEOUS) ×1 IMPLANT
DERMABOND ADVANCED .7 DNX12 (GAUZE/BANDAGES/DRESSINGS) IMPLANT
DRAPE ARM DVNC X/XI (DISPOSABLE) ×4 IMPLANT
DRAPE COLUMN DVNC XI (DISPOSABLE) ×1 IMPLANT
DRIVER NDL LRG 8 DVNC XI (INSTRUMENTS) ×1 IMPLANT
DRIVER NDL MEGA 8 DVNC XI (INSTRUMENTS) ×2 IMPLANT
DRIVER NDLE LRG 8 DVNC XI (INSTRUMENTS) ×1 IMPLANT
DRIVER NDLE MEGA DVNC XI (INSTRUMENTS) ×2 IMPLANT
ELECT REM PT RETURN 15FT ADLT (MISCELLANEOUS) ×1 IMPLANT
FORCEPS CADIERE DVNC XI (FORCEP) ×1 IMPLANT
GLOVE BIOGEL PI IND STRL 7.0 (GLOVE) ×2 IMPLANT
GLOVE SURG SS PI 7.0 STRL IVOR (GLOVE) ×2 IMPLANT
GOWN STRL REUS W/ TWL LRG LVL3 (GOWN DISPOSABLE) ×2 IMPLANT
GOWN STRL REUS W/ TWL XL LVL3 (GOWN DISPOSABLE) IMPLANT
GOWN STRL REUS W/TWL LRG LVL3 (GOWN DISPOSABLE) ×2
GOWN STRL REUS W/TWL XL LVL3 (GOWN DISPOSABLE)
GRASPER TIP-UP FEN DVNC XI (INSTRUMENTS) ×1 IMPLANT
IRRIG SUCT STRYKERFLOW 2 WTIP (MISCELLANEOUS)
IRRIGATION SUCT STRKRFLW 2 WTP (MISCELLANEOUS) IMPLANT
KIT BASIN OR (CUSTOM PROCEDURE TRAY) ×1 IMPLANT
KIT TURNOVER KIT A (KITS) IMPLANT
MARKER SKIN DUAL TIP RULER LAB (MISCELLANEOUS) ×1 IMPLANT
MESH 3DMAX MID 4X6 LT LRG (Mesh General) IMPLANT
MESH 3DMAX MID 4X6 RT LRG (Mesh General) IMPLANT
SCISSORS MNPLR CVD DVNC XI (INSTRUMENTS) ×1 IMPLANT
SEAL UNIV 5-12 XI (MISCELLANEOUS) ×3 IMPLANT
SET TUBE SMOKE EVAC HIGH FLOW (TUBING) ×1 IMPLANT
SPIKE FLUID TRANSFER (MISCELLANEOUS) ×1 IMPLANT
SUT MNCRL AB 4-0 PS2 18 (SUTURE) ×1 IMPLANT
SUT VIC AB 2-0 SH 27 (SUTURE) ×1
SUT VIC AB 2-0 SH 27X BRD (SUTURE) ×1 IMPLANT
SUT VLOC 3-0 9IN GRN (SUTURE) ×1 IMPLANT
TOWEL OR 17X26 10 PK STRL BLUE (TOWEL DISPOSABLE) ×1 IMPLANT
TRAY LAPAROSCOPIC (CUSTOM PROCEDURE TRAY) ×1 IMPLANT
TROCAR Z-THREAD OPTICAL 5X100M (TROCAR) ×1 IMPLANT

## 2022-10-17 NOTE — Progress Notes (Signed)
Report given at 0955 unable to edit time.

## 2022-10-17 NOTE — Discharge Instructions (Signed)
[facility] _______[facility]  UMBILICAL OR INGUINAL HERNIA REPAIR: POST OP INSTRUCTIONS  Always review your discharge instruction sheet given to you by the facility where your surgery was performed. IF YOU HAVE DISABILITY OR FAMILY LEAVE FORMS, YOU MUST BRING THEM TO THE OFFICE FOR PROCESSING.   DO NOT GIVE THEM TO YOUR DOCTOR.  1. A  prescription for pain medication may be given to you upon discharge.  Take your pain medication as prescribed, if needed.  If narcotic pain medicine is not needed, then you may take acetaminophen (Tylenol) or ibuprofen (Advil) as needed. 2. Take your usually prescribed medications unless otherwise directed. If you need a refill on your pain medication, please contact your pharmacy.  They will contact our office to request authorization. Prescriptions will not be filled after 5 pm or on week-ends. 3. You should follow a light diet the first 24 hours after arrival home, such as soup and crackers, etc.  Be sure to include lots of fluids daily.  Resume your normal diet the day after surgery. 4.Most patients will experience some swelling and bruising around the umbilicus or in the groin and scrotum.  Ice packs and reclining will help.  Swelling and bruising can take several days to resolve.  6. It is common to experience some constipation if taking pain medication after surgery.  Increasing fluid intake and taking a stool softener (such as Colace) will usually help or prevent this problem from occurring.  A mild laxative (Milk of Magnesia or Miralax) should be taken according to package directions if there are no bowel movements after 48 hours. 7. Unless discharge instructions indicate otherwise, you may remove your bandages 24-48 hours after surgery, and you may shower at that time.  You may have steri-strips (small skin tapes) in place directly over the incision.  These strips should be left on the skin for 7-10 days.  If your surgeon used skin glue on the  incision, you may shower in 24 hours.  The glue will flake off over the next 2-3 weeks.  Any sutures or staples will be removed at the office during your follow-up visit. 8. ACTIVITIES:  You may resume regular (light) daily activities beginning the next day--such as daily self-care, walking, climbing stairs--gradually increasing activities as tolerated.  You may have sexual intercourse when it is comfortable.  Refrain from any heavy lifting or straining until approved by your doctor.  a.You may drive when you are no longer taking prescription pain medication, you can comfortably wear a seatbelt, and you can safely maneuver your car and apply brakes. b.RETURN TO WORK:   _____________________________________________  9.You should see your doctor in the office for a follow-up appointment approximately 2-3 weeks after your surgery.  Make sure that you call for this appointment within a day or two after you arrive home to insure a convenient appointment time. 10.OTHER INSTRUCTIONS: _________________________    _____________________________________  WHEN TO CALL YOUR DOCTOR: Fever over 101.0 Inability to urinate Nausea and/or vomiting Extreme swelling or bruising Continued bleeding from incision. Increased pain, redness, or drainage from the incision  The clinic staff is available to answer your questions during regular business hours.  Please don't hesitate to call and ask to speak to one of the nurses for clinical concerns.  If you have a medical emergency, go to the nearest emergency room or call 911.  A surgeon from [facility] is always on call at the hospital   [address], [address],   [place], [place]  [address] ?  [address] [address], , [place]   [address] [phone] ? [phone] ? FAX [phone] Web site: [email or web address]  

## 2022-10-17 NOTE — H&P (Signed)
Chief Complaint: New Consultation (Bilateral inguinal hernia eval,)   History of Present Illness: Jim Mcknight is a 72 y.o. male who is seen today as an office consultation at the request of Dr. Hart Rochester for evaluation of New Consultation (Bilateral inguinal hernia eval,) .   He first noticed the hernia 3 months ago. Symptoms are pain in the right groin especially with movement. He denies nausea or vomiting or bowel habit change.  He does not smoke He does not have diabetes He has no history of hernias  Review of Systems: A complete review of systems was obtained from the patient. I have reviewed this information and discussed as appropriate with the patient. See HPI as well for other ROS.  Review of Systems  Constitutional: Negative.  HENT: Negative.  Eyes: Negative.  Respiratory: Negative.  Cardiovascular: Negative.  Gastrointestinal: Positive for abdominal pain.  Genitourinary: Negative.  Musculoskeletal: Negative.  Skin: Negative.  Neurological: Negative.  Endo/Heme/Allergies: Negative.  Psychiatric/Behavioral: Negative.   Medical History: Past Medical History:  Diagnosis Date  Hypertension   There is no problem list on file for this patient.  Past Surgical History:  Procedure Laterality Date  ANAL FISSURECTOMY   No Known Allergies Current Outpatient Medications on File Prior to Visit  Medication Sig Dispense Refill  atorvastatin (LIPITOR) 10 MG tablet Take 10 mg by mouth once daily  lisinopriL-hydroCHLOROthiazide (ZESTORETIC) 20-12.5 mg tablet Take 1 tablet by mouth once daily  metoprolol succinate (TOPROL-XL) 50 MG XL tablet   No current facility-administered medications on file prior to visit.   Family History  Problem Relation Age of Onset  High blood pressure (Hypertension) Mother  Coronary Artery Disease (Blocked arteries around heart) Mother  Breast cancer Mother   Social History   Tobacco Use  Smoking Status Never  Smokeless Tobacco Never    Social History   Socioeconomic History  Marital status: Married  Tobacco Use  Smoking status: Never  Smokeless tobacco: Never  Substance and Sexual Activity  Alcohol use: Not Currently  Drug use: Never   Objective:   Vitals:  09/04/22 0910  BP: (!) 157/85  Pulse: 61  Temp: 36.1 C (97 F)  SpO2: 99%  Weight: 91.6 kg (202 lb)  Height: 185.4 cm (6\' 1" )   Body mass index is 26.65 kg/m.  Physical Exam Constitutional:  Appearance: Normal appearance.  HENT:  Head: Normocephalic and atraumatic.  Pulmonary:  Effort: Pulmonary effort is normal.  Abdominal:  Comments: Moderate right and smaller left inguinal hernias  Musculoskeletal:  General: Normal range of motion.  Cervical back: Normal range of motion.  Neurological:  General: No focal deficit present.  Mental Status: He is alert and oriented to person, place, and time. Mental status is at baseline.  Psychiatric:  Mood and Affect: Mood normal.  Behavior: Behavior normal.  Thought Content: Thought content normal.     Labs, Imaging and Diagnostic Testing: I reviewed notes by Linton Rump  Assessment and Plan:  Diagnoses and all orders for this visit:  Non-recurrent bilateral inguinal hernia without obstruction or gangrene    We discussed etiology of hernias and how they can cause pain. We discussed options for inguinal hernia repair vs observation. We discussed details of the surgery of general anesthesia, surgical approach and incisions, dissecting the sack away from vas deference, testicular vessels and nerves and placement of mesh. We discussed risks of bleeding, infection, recurrence, injury to vas deference, testicular vessels, nerve injury, and chronic pain. He showed good understanding and wanted proceed with minimally invasive  bilateral inguinal hernia repair as outpatient.

## 2022-10-17 NOTE — Op Note (Signed)
Preop diagnosis: bilateral inguinal hernia  Postop diagnosis: bilateral direct inguinal hernia  Procedure: Robotic  Bilateral inguinal hernia repair with mesh  Surgeon: Feliciana Rossetti, M.D.  Asst: none  Anesthesia: Gen.   Indications for procedure: Jim Mcknight is a 72 y.o. male with symptoms of pain and enlarging Bilateral inguinal hernia(s). After discussing risks, alternatives and benefits he decided on robotic repair and was brought to day surgery for repair.  Description of procedure: The patient was brought into the operative suite, placed supine. Anesthesia was administered with endotracheal tube. Patient was strapped in place. The patient was prepped and draped in the usual sterile fashion.  A small transverse incision was made just left of midline about 20 cm cephalad of the pubic symphysis. A 62mm trocar was used to gain access to the peritoneal cavity by optical entry technique. Pneumoperitoneum was applied with a high flow and low pressure. The laparoscope was reinserted to confirm position.  Next Exparel:Marcaine mix was used for bilateral TAP blocks. 1 8 mm trocar was placed in the right mid abdomen. 1 8 mm trocar was placed in the left mid abdomen. An additional 8 mm trocar was placed on the right lateral area. The 5 mm trocar was upsized to an 8 mm trocar. The patient was placed in trendelenberg position. The robot was docked.  On initial visualization , there was a large right direct and large left direct inguinal hernias. A peritoneal flap was created on the right. This was continued medially to the pubic bone medially and laterally to muscles. Hernia sac was completely dissected out of the canal. Vas deference and contents of the cord were safely dissected away of the hernia sac.    A peritoneal flap was created on the left. This was continued medially to the pubic bone medially and laterally to muscles. Hernia sac was completely dissected out of the canal. Vas deference  and contents of the cord were safely dissected away of the hernia sac.   A large mid weight 3D max mesh was inserted on the right and sutured with 2-0 vicryl medially to the lacunar ligament. A large mid weight 3D max mesh was inserted on the left and sutured with a 2-0 vicryl medially to the lacunar ligament. There were 2 cm of overlap of the meshes. The mesh was positioned flat and directly up against the direct and indirect areas. The peritoneal flap was sewn back up with running 3-0 v loc sutures.  The CO2 was evacuated while watching to ensure the mesh did not migrate. All skin incisions were closed with 4-0 monocryl subcu stitch. The patient awoke from anesthesia and was brought to PACU in stable condition.  Findings: bilateral direct inguinal hernia  Specimen: none  Blood loss: 10 ml  Local anesthesia: 50 ml Exparel:Marcaine mix  Complications: none  Implant: large mid weight right and left Bard 3D max mesh  Feliciana Rossetti, M.D. General, Bariatric, & Minimally Invasive Surgery [facility] [place] 9:10 AM 10/17/2022

## 2022-10-17 NOTE — Anesthesia Postprocedure Evaluation (Signed)
Anesthesia Post Note  Patient: Jim Mcknight  Procedure(s) Performed: XI ROBOTIC ASSISTED INGUINAL HERNIA REPAIR WITH MESH (Bilateral)     Patient location during evaluation: PACU Anesthesia Type: General Level of consciousness: awake and alert and oriented Pain management: pain level controlled Vital Signs Assessment: post-procedure vital signs reviewed and stable Respiratory status: spontaneous breathing, nonlabored ventilation and respiratory function stable Cardiovascular status: blood pressure returned to baseline and stable Postop Assessment: no apparent nausea or vomiting Anesthetic complications: no   No notable events documented.  Last Vitals:  Vitals:   10/17/22 0945 10/17/22 1015  BP: (!) 161/95 (!) 151/102  Pulse: 66 62  Resp: 15   Temp:  36.9 C  SpO2: 100% 100%    Last Pain:  Vitals:   10/17/22 1015  TempSrc:   PainSc: 2                  Emmalia Heyboer A.

## 2022-10-17 NOTE — Transfer of Care (Signed)
Immediate Anesthesia Transfer of Care Note  Patient: Jim Mcknight  Procedure(s) Performed: XI ROBOTIC ASSISTED INGUINAL HERNIA REPAIR WITH MESH (Bilateral)  Patient Location: PACU  Anesthesia Type:General  Level of Consciousness: drowsy  Airway & Oxygen Therapy: Patient Spontanous Breathing and Patient connected to face mask  Post-op Assessment: Report given to RN and Post -op Vital signs reviewed and stable  Post vital signs: Reviewed and stable  Last Vitals:  Vitals Value Taken Time  BP 161/95 10/17/22 0919  Temp    Pulse 76 10/17/22 0920  Resp 15 10/17/22 0920  SpO2 100 % 10/17/22 0920  Vitals shown include unvalidated device data.  Last Pain:  Vitals:   10/17/22 0552  TempSrc: Oral         Complications: No notable events documented.

## 2022-10-17 NOTE — Anesthesia Procedure Notes (Signed)
Procedure Name: Intubation Date/Time: 10/17/2022 7:33 AM  Performed by: Vanessa , CRNAPre-anesthesia Checklist: Patient identified, Emergency Drugs available, Suction available and Patient being monitored Patient Re-evaluated:Patient Re-evaluated prior to induction Oxygen Delivery Method: Circle system utilized Preoxygenation: Pre-oxygenation with 100% oxygen Induction Type: IV induction Ventilation: Mask ventilation without difficulty Laryngoscope Size: 2 and Miller Grade View: Grade I Tube type: Oral Tube size: 7.5 mm Number of attempts: 1 Airway Equipment and Method: Stylet Placement Confirmation: ETT inserted through vocal cords under direct vision, positive ETCO2 and breath sounds checked- equal and bilateral Secured at: 22 cm Tube secured with: Tape Dental Injury: Teeth and Oropharynx as per pre-operative assessment

## 2022-10-18 ENCOUNTER — Encounter (HOSPITAL_COMMUNITY): Payer: Self-pay | Admitting: General Surgery

## 2022-11-28 DIAGNOSIS — L237 Allergic contact dermatitis due to plants, except food: Secondary | ICD-10-CM | POA: Diagnosis not present

## 2022-11-28 DIAGNOSIS — Z6826 Body mass index (BMI) 26.0-26.9, adult: Secondary | ICD-10-CM | POA: Diagnosis not present

## 2022-12-09 DIAGNOSIS — E7800 Pure hypercholesterolemia, unspecified: Secondary | ICD-10-CM | POA: Diagnosis not present

## 2022-12-09 DIAGNOSIS — Z1389 Encounter for screening for other disorder: Secondary | ICD-10-CM | POA: Diagnosis not present

## 2022-12-09 DIAGNOSIS — E663 Overweight: Secondary | ICD-10-CM | POA: Diagnosis not present

## 2022-12-09 DIAGNOSIS — Z0000 Encounter for general adult medical examination without abnormal findings: Secondary | ICD-10-CM | POA: Diagnosis not present

## 2022-12-09 DIAGNOSIS — I10 Essential (primary) hypertension: Secondary | ICD-10-CM | POA: Diagnosis not present

## 2022-12-16 DIAGNOSIS — M21371 Foot drop, right foot: Secondary | ICD-10-CM | POA: Diagnosis not present

## 2022-12-16 DIAGNOSIS — H9312 Tinnitus, left ear: Secondary | ICD-10-CM | POA: Diagnosis not present

## 2022-12-16 DIAGNOSIS — Z6827 Body mass index (BMI) 27.0-27.9, adult: Secondary | ICD-10-CM | POA: Diagnosis not present

## 2022-12-16 DIAGNOSIS — R29898 Other symptoms and signs involving the musculoskeletal system: Secondary | ICD-10-CM | POA: Diagnosis not present

## 2022-12-16 DIAGNOSIS — I10 Essential (primary) hypertension: Secondary | ICD-10-CM | POA: Diagnosis not present

## 2022-12-16 DIAGNOSIS — E7800 Pure hypercholesterolemia, unspecified: Secondary | ICD-10-CM | POA: Diagnosis not present

## 2023-01-06 DIAGNOSIS — H43811 Vitreous degeneration, right eye: Secondary | ICD-10-CM | POA: Diagnosis not present

## 2023-01-06 DIAGNOSIS — H524 Presbyopia: Secondary | ICD-10-CM | POA: Diagnosis not present

## 2023-01-15 DIAGNOSIS — H903 Sensorineural hearing loss, bilateral: Secondary | ICD-10-CM | POA: Diagnosis not present

## 2023-02-06 DIAGNOSIS — C61 Malignant neoplasm of prostate: Secondary | ICD-10-CM | POA: Diagnosis not present

## 2023-02-07 DIAGNOSIS — C61 Malignant neoplasm of prostate: Secondary | ICD-10-CM | POA: Diagnosis not present

## 2023-02-14 DIAGNOSIS — C61 Malignant neoplasm of prostate: Secondary | ICD-10-CM | POA: Diagnosis not present

## 2023-03-26 DIAGNOSIS — D225 Melanocytic nevi of trunk: Secondary | ICD-10-CM | POA: Diagnosis not present

## 2023-03-26 DIAGNOSIS — L570 Actinic keratosis: Secondary | ICD-10-CM | POA: Diagnosis not present

## 2023-03-26 DIAGNOSIS — D485 Neoplasm of uncertain behavior of skin: Secondary | ICD-10-CM | POA: Diagnosis not present

## 2023-03-26 DIAGNOSIS — Z85828 Personal history of other malignant neoplasm of skin: Secondary | ICD-10-CM | POA: Diagnosis not present

## 2023-03-26 DIAGNOSIS — Z85820 Personal history of malignant melanoma of skin: Secondary | ICD-10-CM | POA: Diagnosis not present

## 2023-03-26 DIAGNOSIS — D0439 Carcinoma in situ of skin of other parts of face: Secondary | ICD-10-CM | POA: Diagnosis not present

## 2023-03-26 DIAGNOSIS — L821 Other seborrheic keratosis: Secondary | ICD-10-CM | POA: Diagnosis not present

## 2023-03-26 DIAGNOSIS — L812 Freckles: Secondary | ICD-10-CM | POA: Diagnosis not present

## 2023-03-26 DIAGNOSIS — D0471 Carcinoma in situ of skin of right lower limb, including hip: Secondary | ICD-10-CM | POA: Diagnosis not present

## 2023-03-31 ENCOUNTER — Ambulatory Visit (INDEPENDENT_AMBULATORY_CARE_PROVIDER_SITE_OTHER): Payer: No Typology Code available for payment source | Admitting: Neurology

## 2023-03-31 ENCOUNTER — Encounter: Payer: Self-pay | Admitting: Neurology

## 2023-03-31 VITALS — BP 129/83 | HR 68 | Ht 73.0 in | Wt 190.0 lb

## 2023-03-31 DIAGNOSIS — M21371 Foot drop, right foot: Secondary | ICD-10-CM | POA: Diagnosis not present

## 2023-03-31 NOTE — Patient Instructions (Signed)
MRI lumbar spine EMG nerve conduction study Referral to physical therapy Follow-up in 1 year or sooner if worse.

## 2023-03-31 NOTE — Progress Notes (Signed)
[facility]  PATIENT: Jim Mcknight DOB: 1951/05/04  REQUESTING CLINICIAN: Sigmund Hazel, MD HISTORY FROM: Patient  REASON FOR VISIT: Right foot drop    HISTORICAL  CHIEF COMPLAINT:  Chief Complaint  Patient presents with   New Patient (Initial Visit)    RM12, ALONE, R foot drop, MID BACK PAIN: ONGOING 1 YEAR    HISTORY OF PRESENT ILLNESS:  This is a 72 year old gentleman past medical history of hypertension, hyperlipidemia, history of enlarged prostate who is presenting with painless foot drop for the past year.  Patient reports in past year she started noting low back pain, in the middle of low back, denies any radiation of the pain to right or left leg or buttock.  At the same time he also noticed that his right foot was weak.  He reports when walking his right foot will get caught.  Denies any falls.  He has not completed any physical therapy.  Denies any numbness or open associated with the right foot weakness.  No other complaints.   OTHER MEDICAL CONDITIONS: Hypertension, Hyperlipidemia, Enlarged prostate   REVIEW OF SYSTEMS: Full 14 system review of systems performed and negative with exception of:  As noted in the HPI   ALLERGIES: No Known Allergies  HOME MEDICATIONS: Outpatient Medications Prior to Visit  Medication Sig Dispense Refill   aspirin 81 MG chewable tablet Chew 81 mg by mouth daily.     atorvastatin (LIPITOR) 10 MG tablet Take 10 mg by mouth daily.     Cholecalciferol (VITAMIN D-3) 1000 units CAPS Take 1,000 Units by mouth daily.      Coenzyme Q10 (CO Q 10) 100 MG CAPS Take 100 mg by mouth daily.     Coenzyme Q10-Red Yeast Rice 25-600 MG CAPS Take 1 capsule by mouth daily.     folic acid (FOLVITE) 800 MCG tablet Take 800 mcg by mouth daily.     lisinopril-hydrochlorothiazide (PRINZIDE,ZESTORETIC) 20-12.5 MG tablet Take 1 tablet by mouth every morning.     metoprolol succinate (TOPROL-XL) 50 MG 24 hr tablet Take 50 mg by mouth  daily.     vitamin C (ASCORBIC ACID) 500 MG tablet Take 500 mg by mouth daily.     ibuprofen (ADVIL) 800 MG tablet Take 1 tablet (800 mg total) by mouth every 8 (eight) hours as needed. 15 tablet 0   oxyCODONE (OXY IR/ROXICODONE) 5 MG immediate release tablet Take 1 tablet (5 mg total) by mouth every 6 (six) hours as needed for severe pain. 15 tablet 0   No facility-administered medications prior to visit.    PAST MEDICAL HISTORY: Past Medical History:  Diagnosis Date   Arthritis    Cancer (HCC)    right shoulder Melanoma removed   Elevated PSA    Hypertension    Wears glasses     PAST SURGICAL HISTORY: Past Surgical History:  Procedure Laterality Date   ANAL FISSURE REPAIR  1978   EXCISION METACARPAL MASS Left 01/07/2022   Procedure: LEFT THUMB MASS EXCISION;  Surgeon: Betha Loa, MD;  Location: [facility];  Service: Orthopedics;  Laterality: Left;   PROSTATE BIOPSY N/A 08/22/2016   Procedure: BIOPSY TRANSRECTAL ULTRASONIC PROSTATE (TUBP);  Surgeon: Jethro Bolus, MD;  Location: [facility];  Service: Urology;  Laterality: N/A;   PROSTATE BIOPSY N/A 01/27/2020   Procedure: BIOPSY TRANSRECTAL ULTRASONIC PROSTATE (TUBP);  Surgeon: Heloise Purpura, MD;  Location: [facility];  Service: Urology;  Laterality: N/A;   TENDON EXPLORATION Left 01/07/2022  Procedure: DEBRIDEMENT INTERPHALANGEAL JOINT;  Surgeon: Betha Loa, MD;  Location: [facility];  Service: Orthopedics;  Laterality: Left;   XI ROBOTIC ASSISTED INGUINAL HERNIA REPAIR WITH MESH Bilateral 10/17/2022   Procedure: XI ROBOTIC ASSISTED INGUINAL HERNIA REPAIR WITH MESH;  Surgeon: Kinsinger, De Blanch, MD;  Location: [facility];  Service: General;  Laterality: Bilateral;  MESH    FAMILY HISTORY: History reviewed. No pertinent family history.  SOCIAL HISTORY: Social History   Socioeconomic History   Marital status: Married    Spouse name: Not on file   Number of children: Not on  file   Years of education: Not on file   Highest education level: Not on file  Occupational History   Not on file  Tobacco Use   Smoking status: Never   Smokeless tobacco: Never  Vaping Use   Vaping status: Never Used  Substance and Sexual Activity   Alcohol use: No   Drug use: No   Sexual activity: Yes    Birth control/protection: None  Other Topics Concern   Not on file  Social History Narrative   Not on file   Social Determinants of Health   Financial Resource Strain: Not on file  Food Insecurity: Not on file  Transportation Needs: Not on file  Physical Activity: Not on file  Stress: Not on file  Social Connections: Not on file  Intimate Partner Violence: Not on file    PHYSICAL EXAM  GENERAL EXAM/CONSTITUTIONAL: Vitals:  Vitals:   03/31/23 0927  BP: 129/83  Pulse: 68  Weight: 190 lb (86.2 kg)  Height: 6\' 1"  (1.854 m)   Body mass index is 25.07 kg/m. Wt Readings from Last 3 Encounters:  03/31/23 190 lb (86.2 kg)  10/17/22 197 lb (89.4 kg)  10/03/22 197 lb (89.4 kg)   Patient is in no distress; well developed, nourished and groomed; neck is supple  MUSCULOSKELETAL: Gait, strength, tone, movements noted in Neurologic exam below  NEUROLOGIC: MENTAL STATUS:      No data to display         awake, alert, oriented to person, place and time recent and remote memory intact normal attention and concentration language fluent, comprehension intact, naming intact fund of knowledge appropriate  CRANIAL NERVE:  2nd, 3rd, 4th, 6th - Visual fields full to confrontation, extraocular muscles intact, no nystagmus 5th - facial sensation symmetric 7th - facial strength symmetric 8th - hearing intact 9th - palate elevates symmetrically, uvula midline 11th - shoulder shrug symmetric 12th - tongue protrusion midline  MOTOR:  normal bulk and tone, full strength in the BUE, BLE except for decrease dorsiflexion on the right   SENSORY:  Decrease vibration on the  right   COORDINATION:  finger-nose-finger, fine finger movements normal  REFLEXES:  deep tendon reflexes present and symmetric  GAIT/STATION:  Normal (no high steppage gait seen, ?patient aware)   DIAGNOSTIC DATA (LABS, IMAGING, TESTING) - I reviewed patient records, labs, notes, testing and imaging myself where available.  Lab Results  Component Value Date   WBC 8.9 10/03/2022   HGB 13.1 10/03/2022   HCT 41.2 10/03/2022   MCV 84.6 10/03/2022   PLT 318 10/03/2022      Component Value Date/Time   NA 137 10/03/2022 0817   K 3.7 10/03/2022 0817   CL 103 10/03/2022 0817   CO2 26 10/03/2022 0817   GLUCOSE 94 10/03/2022 0817   BUN 12 10/03/2022 0817   CREATININE 1.14 10/03/2022 0817   CALCIUM 8.9 10/03/2022 0817  GFRNONAA >60 10/03/2022 0817   GFRAA >60 01/13/2020 0841   No results found for: "CHOL", "HDL", "LDLCALC", "LDLDIRECT", "TRIG", "CHOLHDL" No results found for: "HGBA1C" No results found for: "VITAMINB12" No results found for: "TSH"    ASSESSMENT AND PLAN  72 y.o. year old male with hypertension, hyperlipidemia, enlarged prostate who is presenting with painless foot drop for the past year.  Denies any fall.  On exam he does have decreased vibratory sense on the right when compared to the left foot and also decrease dorsiflexion of the right foot 4-/5 when compared to the left.  His eversion and inversion and plantar flexion was normal.  I do suspect a peroneal nerve palsy causing the foot drop but due to his history of back pain and enlarged prostate, would like to rule out L5 radiculopathy. Will also obtain a EMG/NCS.    1. Right foot drop      Patient Instructions  MRI lumbar spine EMG nerve conduction study Referral to physical therapy Follow-up in 1 year or sooner if worse.  Orders Placed This Encounter  Procedures   MR LUMBAR SPINE WO CONTRAST   Ambulatory referral to Physical Therapy   NCV with EMG(electromyography)    No orders of the defined  types were placed in this encounter.   Return in about 1 year (around 03/30/2024).    Windell Norfolk, MD 03/31/2023, 10:04 AM  [facility] [address] [place], [place] [address] [phone]

## 2023-04-03 ENCOUNTER — Telehealth: Payer: Self-pay | Admitting: Neurology

## 2023-04-03 NOTE — Telephone Encounter (Signed)
[employer] auth: [id number] exp. 04/03/23-06/03/23 sent to GI [phone]

## 2023-04-05 ENCOUNTER — Encounter (HOSPITAL_BASED_OUTPATIENT_CLINIC_OR_DEPARTMENT_OTHER): Payer: Self-pay | Admitting: Emergency Medicine

## 2023-04-05 ENCOUNTER — Emergency Department (HOSPITAL_COMMUNITY): Payer: No Typology Code available for payment source

## 2023-04-05 ENCOUNTER — Other Ambulatory Visit: Payer: Self-pay

## 2023-04-05 ENCOUNTER — Emergency Department (HOSPITAL_BASED_OUTPATIENT_CLINIC_OR_DEPARTMENT_OTHER): Payer: No Typology Code available for payment source

## 2023-04-05 ENCOUNTER — Emergency Department (HOSPITAL_BASED_OUTPATIENT_CLINIC_OR_DEPARTMENT_OTHER)
Admission: EM | Admit: 2023-04-05 | Discharge: 2023-04-05 | Disposition: A | Discharge status: Home / Self Care | Payer: No Typology Code available for payment source | Attending: Emergency Medicine | Admitting: Emergency Medicine

## 2023-04-05 ENCOUNTER — Emergency Department (HOSPITAL_BASED_OUTPATIENT_CLINIC_OR_DEPARTMENT_OTHER): Payer: No Typology Code available for payment source | Admitting: Radiology

## 2023-04-05 DIAGNOSIS — Z7982 Long term (current) use of aspirin: Secondary | ICD-10-CM | POA: Diagnosis not present

## 2023-04-05 DIAGNOSIS — R079 Chest pain, unspecified: Secondary | ICD-10-CM | POA: Diagnosis not present

## 2023-04-05 DIAGNOSIS — Z79899 Other long term (current) drug therapy: Secondary | ICD-10-CM | POA: Diagnosis not present

## 2023-04-05 DIAGNOSIS — I10 Essential (primary) hypertension: Secondary | ICD-10-CM | POA: Insufficient documentation

## 2023-04-05 DIAGNOSIS — R1013 Epigastric pain: Secondary | ICD-10-CM | POA: Insufficient documentation

## 2023-04-05 DIAGNOSIS — J90 Pleural effusion, not elsewhere classified: Secondary | ICD-10-CM | POA: Diagnosis not present

## 2023-04-05 DIAGNOSIS — R1011 Right upper quadrant pain: Secondary | ICD-10-CM | POA: Diagnosis not present

## 2023-04-05 DIAGNOSIS — R0989 Other specified symptoms and signs involving the circulatory and respiratory systems: Secondary | ICD-10-CM | POA: Diagnosis not present

## 2023-04-05 DIAGNOSIS — K829 Disease of gallbladder, unspecified: Secondary | ICD-10-CM | POA: Diagnosis not present

## 2023-04-05 DIAGNOSIS — K824 Cholesterolosis of gallbladder: Secondary | ICD-10-CM | POA: Diagnosis not present

## 2023-04-05 DIAGNOSIS — I700 Atherosclerosis of aorta: Secondary | ICD-10-CM | POA: Diagnosis not present

## 2023-04-05 DIAGNOSIS — R0789 Other chest pain: Secondary | ICD-10-CM | POA: Insufficient documentation

## 2023-04-05 DIAGNOSIS — J9811 Atelectasis: Secondary | ICD-10-CM | POA: Diagnosis not present

## 2023-04-05 LAB — COMPREHENSIVE METABOLIC PANEL
ALT: 24 U/L (ref 0–44)
AST: 22 U/L (ref 15–41)
Albumin: 3.9 g/dL (ref 3.5–5.0)
Alkaline Phosphatase: 83 U/L (ref 38–126)
Anion gap: 10 (ref 5–15)
BUN: 10 mg/dL (ref 8–23)
CO2: 29 mmol/L (ref 22–32)
Calcium: 9.7 mg/dL (ref 8.9–10.3)
Chloride: 100 mmol/L (ref 98–111)
Creatinine, Ser: 1.15 mg/dL (ref 0.61–1.24)
GFR, Estimated: >60 mL/min (ref >60)
Glucose, Bld: 102 mg/dL — ABNORMAL HIGH (ref 70–99)
Potassium: 3.2 mmol/L — ABNORMAL LOW (ref 3.5–5.1)
Sodium: 139 mmol/L (ref 135–145)
Total Bilirubin: 0.6 mg/dL (ref 0.3–1.2)
Total Protein: 6.6 g/dL (ref 6.5–8.1)

## 2023-04-05 LAB — CBC
HCT: 40.7 % (ref 39.0–52.0)
Hemoglobin: 13.7 g/dL (ref 13.0–17.0)
MCH: 28.7 pg (ref 26.0–34.0)
MCHC: 33.7 g/dL (ref 30.0–36.0)
MCV: 85.3 fL (ref 80.0–100.0)
Platelets: 276 10*3/uL (ref 150–400)
RBC: 4.77 MIL/uL (ref 4.22–5.81)
RDW: 15.7 % — ABNORMAL HIGH (ref 11.5–15.5)
WBC: 7.4 10*3/uL (ref 4.0–10.5)
nRBC: 0 % (ref 0.0–0.2)

## 2023-04-05 LAB — TROPONIN I (HIGH SENSITIVITY)
Troponin I (High Sensitivity): 4 ng/L (ref <18)
Troponin I (High Sensitivity): 7 ng/L (ref <18)

## 2023-04-05 LAB — LIPASE, BLOOD: Lipase: 57 U/L — ABNORMAL HIGH (ref 11–51)

## 2023-04-05 MED ORDER — IOHEXOL 350 MG/ML SOLN
100.0000 mL | Freq: Once | INTRAVENOUS | Status: AC | PRN
  Administered 2023-04-05: 100 mL via INTRAVENOUS

## 2023-04-05 MED ORDER — HYDROMORPHONE HCL 1 MG/ML IJ SOLN: 0.5000 mg | INTRAMUSCULAR | Status: DC | PRN

## 2023-04-05 NOTE — ED Notes (Signed)
Transferred via pov to [facility] ed for ed to ed tranfer

## 2023-04-05 NOTE — ED Provider Notes (Signed)
[place] EMERGENCY DEPARTMENT AT [facility] Provider Note  CSN: [id number] Arrival date & time: 04/05/23 3474  Chief Complaint(s) Chest Pain and Shortness of Breath  HPI Jim Mcknight is a 72 y.o. male with a past medical history listed below who presents to the emergency department with 3 hours of epigastric abdominal pain radiating up to the chest.  Pain is described as pressure-like.  Initially believed to be acid reflux but when it did not improve with Tums and patient became nauseated and diaphoretic, he presented for evaluation.  Patient endorses shortness of breath related to the pain.  No overt emesis.  No diarrhea.  The history is provided by the patient.    Past Medical History Past Medical History:  Diagnosis Date   Arthritis    Cancer (HCC)    right shoulder Melanoma removed   Elevated PSA    Hypertension    Wears glasses    There are no problems to display for this patient.  Home Medication(s) Prior to Admission medications   Medication Sig Start Date End Date Taking? Authorizing Provider  aspirin 81 MG chewable tablet Chew 81 mg by mouth daily. 02/10/14   [provider]  atorvastatin (LIPITOR) 10 MG tablet Take 10 mg by mouth daily. 10/19/19   [provider]  Cholecalciferol (VITAMIN D-3) 1000 units CAPS Take 1,000 Units by mouth daily.     [provider]  Coenzyme Q10 (CO Q 10) 100 MG CAPS Take 100 mg by mouth daily.    [provider]  Coenzyme Q10-Red Yeast Rice 25-600 MG CAPS Take 1 capsule by mouth daily. 11/29/21   [provider]  folic acid (FOLVITE) 800 MCG tablet Take 800 mcg by mouth daily.    [provider]  lisinopril-hydrochlorothiazide (PRINZIDE,ZESTORETIC) 20-12.5 MG tablet Take 1 tablet by mouth every morning.    [provider]  metoprolol succinate (TOPROL-XL) 50 MG 24 hr tablet Take 50 mg by mouth daily. 12/08/19   [provider]  vitamin C (ASCORBIC ACID) 500  MG tablet Take 500 mg by mouth daily.    [provider]                                                                                                                                    Allergies Patient has no known allergies.  Review of Systems Review of Systems As noted in HPI  Physical Exam Vital Signs  I have reviewed the triage vital signs BP (!) 184/111   Pulse 69   Temp 97.9 F (36.6 C) (Oral)   Resp 20   Ht 6\' 1"  (1.854 m)   Wt 86.2 kg   SpO2 94%   BMI 25.07 kg/m   Physical Exam Vitals reviewed.  Constitutional:      General: He is not in acute distress.    Appearance: He is well-developed. He is not diaphoretic.  HENT:  Head: Normocephalic and atraumatic.     Nose: Nose normal.  Eyes:     General: No scleral icterus.       Right eye: No discharge.        Left eye: No discharge.     Conjunctiva/sclera: Conjunctivae normal.     Pupils: Pupils are equal, round, and reactive to light.  Cardiovascular:     Rate and Rhythm: Normal rate and regular rhythm.     Heart sounds: No murmur heard.    No friction rub. No gallop.  Pulmonary:     Effort: Pulmonary effort is normal. No respiratory distress.     Breath sounds: Normal breath sounds. No stridor. No rales.  Abdominal:     General: There is no distension.     Palpations: Abdomen is soft.     Tenderness: There is abdominal tenderness in the right upper quadrant. There is no guarding or rebound.  Musculoskeletal:        General: No tenderness.     Cervical back: Normal range of motion and neck supple.  Skin:    General: Skin is warm and dry.     Findings: No erythema or rash.  Neurological:     Mental Status: He is alert and oriented to person, place, and time.     ED Results and Treatments Labs (all labs ordered are listed, but only abnormal results are displayed) Labs Reviewed  CBC - Abnormal; Notable for the following components:      Result Value   RDW 15.7 (*)    All other  components within normal limits  COMPREHENSIVE METABOLIC PANEL - Abnormal; Notable for the following components:   Potassium 3.2 (*)    Glucose, Bld 102 (*)    All other components within normal limits  LIPASE, BLOOD - Abnormal; Notable for the following components:   Lipase 57 (*)    All other components within normal limits  TROPONIN I (HIGH SENSITIVITY)  TROPONIN I (HIGH SENSITIVITY)                                                                                                                         EKG  EKG Interpretation Date/Time:  Saturday April 05 2023 04:10:47 EDT Ventricular Rate:  58 PR Interval:  178 QRS Duration:  108 QT Interval:  436 QTC Calculation: 429 R Axis:   37  Text Interpretation: Sinus rhythm No acute changes Confirmed by Drema Pry [phone]) on 04/05/2023 5:10:59 AM       Radiology CT Angio Chest/Abd/Pel for Dissection W and/or Wo Contrast  Result Date: 04/05/2023 CLINICAL DATA:  Chest pain a wakening the patient from sleep, worsening. A EXAM: CT ANGIOGRAPHY CHEST, ABDOMEN AND PELVIS TECHNIQUE: Non-contrast CT of the chest was initially obtained. Multidetector CT imaging through the chest, abdomen and pelvis was performed using the standard protocol during bolus administration of intravenous contrast. Multiplanar reconstructed images and MIPs were obtained and reviewed to evaluate the vascular anatomy. RADIATION DOSE  REDUCTION: This exam was performed according to the departmental dose-optimization program which includes automated exposure control, adjustment of the mA and/or kV according to patient size and/or use of iterative reconstruction technique. CONTRAST:  OMNIPAQUE IOHEXOL 350 MG/ML SOLN COMPARISON:  None Available. FINDINGS: CTA CHEST FINDINGS Cardiovascular: Preferential opacification of the thoracic aorta. No evidence of thoracic aortic aneurysm or dissection. Normal heart size. No pericardial effusion. Mediastinum/Nodes: Negative for mass  or adenopathy Lungs/Pleura: There is no edema, consolidation, effusion, or pneumothorax. Mild dependent atelectasis. Musculoskeletal: Thoracic spondylosis.  No explanation for symptoms. Review of the MIP images confirms the above findings. CTA ABDOMEN AND PELVIS FINDINGS VASCULAR Aorta: Atheromatous calcification.  No dissection or aneurysm Celiac: Unremarkable SMA: Unremarkable Renals: Smoothly contoured and widely patent IMA: Normal Inflow: Atheromatous plaque that is mild. No stenosis, aneurysm, or dissection Veins: Unremarkable Review of the MIP images confirms the above findings. NON-VASCULAR Hepatobiliary: No focal liver abnormality.Mild fat stranding around the neck of the gallbladder. No calcified stone or biliary dilatation. Pancreas: Unremarkable. Spleen: Unremarkable. Adrenals/Urinary Tract: Negative adrenals. No hydronephrosis or stone. Unremarkable bladder. Stomach/Bowel: No obstruction. No appendicitis. Distal colonic diverticulosis. Lymphatic: No mass or adenopathy. Reproductive:No pathologic findings. Other: No ascites or pneumoperitoneum. Musculoskeletal: No acute abnormalities.  Ordinary spondylosis. Review of the MIP images confirms the above findings. IMPRESSION: 1. Negative for acute aortic syndrome.  Mild atherosclerosis. 2. Subtle fat stranding around the upper gallbladder, consider biliary cause of symptoms. Electronically Signed   By: Tiburcio Pea M.D.   On: 04/05/2023 06:23   DG Chest 2 View  Result Date: 04/05/2023 CLINICAL DATA:  72 year old male with history of chest pain that woke him from sleeping. EXAM: CHEST - 2 VIEW COMPARISON:  No priors. FINDINGS: Lung volumes are low. No consolidative airspace disease. Trace left pleural effusion. No right pleural effusion. No pneumothorax. No pulmonary nodule or mass noted. Pulmonary vasculature and the cardiomediastinal silhouette are within normal limits. IMPRESSION: 1. Trace left pleural effusion. Electronically Signed   By: Trudie Reed M.D.   On: 04/05/2023 05:05    Medications Ordered in ED Medications  HYDROmorphone (DILAUDID) injection 0.5 mg (has no administration in time range)  iohexol (OMNIPAQUE) 350 MG/ML injection 100 mL (100 mLs Intravenous Contrast Given 04/05/23 0550)   Procedures Procedures  (including critical care time) Medical Decision Making / ED Course   Medical Decision Making Amount and/or Complexity of Data Reviewed Labs: ordered. Decision-making details documented in ED Course. Radiology: ordered and independent interpretation performed. Decision-making details documented in ED Course. ECG/medicine tests: ordered and independent interpretation performed. Decision-making details documented in ED Course.  Risk Prescription drug management. Parenteral controlled substances. Decision regarding hospitalization.    Workup for differential diagnosis listed below  Pain appears to be mostly upper abdominal but given patient's age, cardiac workup was obtained.  EKG without acute ischemic changes or evidence of pericarditis.  Initial troponin negative.  Plan for delta troponin.  Patient also worked up for aortic dissection given significant hypertension.  CT a negative for dissection or obvious PEs.  No evidence of pneumonia, pneumothorax, pulmonary edema on imaging either.  CT did reveal evidence of gallbladder wall thickening/stranding.  LFTs are normal.  Lipase slightly elevated.  Plan for right upper quadrant ultrasound.  Today ultrasound is not available until 2 PM.  If troponin is negative, patient will need to be transferred by POV to [facility] for ultrasound.  Patient was provided with IV pain medicine which provided significant relief.  Blood pressure is improving as  pain is subsides.  Patient care turned over to oncoming provider. Patient case and results discussed in detail; please see their note for further ED managment.       Final Clinical Impression(s) / ED  Diagnoses Final diagnoses:  None    This chart was dictated using voice recognition software.  Despite best efforts to proofread,  errors can occur which can change the documentation meaning.    Nira Conn, MD 04/05/23 [phone]

## 2023-04-05 NOTE — Discharge Instructions (Addendum)
Follow-up with your primary care doctor and cardiology.  Return if symptoms worsen.  You do have maybe a small polyp in your gallbladder.  This is nonspecific.  Discussed with your primary care doctor.

## 2023-04-05 NOTE — ED Triage Notes (Signed)
  Patient comes in with chest pain and SOB that woke him up from sleep around 0100 this morning.  Patient states he took several tums to try to relieve pain but did not help.  Patient states the pain got worse and he became nauseous and diaphoretic.  No hx of cardiac issues.  BP 234/110 during triage.  Pain 10/10, pressure.

## 2023-04-05 NOTE — ED Provider Notes (Signed)
Patient transferred for right upper quadrant ultrasound which was unremarkable.  Maybe a small polyp but no gallstones or infectious process.  Troponin negative x 2.  CT angio study unremarkable.  Overall suspect reflux related process.  Seems atypical for ACS.  Does do fairly exertional activity and does not have chest pain with it.  Will have him follow-up with cardiology to be thorough as well as given some family history of cardiac disease.  Understands return precautions.  Discharged in good condition.  This chart was dictated using voice recognition software.  Despite best efforts to proofread,  errors can occur which can change the documentation meaning.    Virgina Norfolk, DO 04/05/23 [phone]

## 2023-04-05 NOTE — ED Provider Notes (Signed)
Received signout from overnight provider.  Pending repeat troponin and needs ultrasound  Patient feeling improved, repeat examination  Physical Exam  BP (!) 184/111   Pulse 69   Temp 97.9 F (36.6 C) (Oral)   Resp 20   Ht 6\' 1"  (1.854 m)   Wt 86.2 kg   SpO2 94%   BMI 25.07 kg/m   Physical Exam Vitals and nursing note reviewed.  Constitutional:      General: He is not in acute distress. Cardiovascular:     Rate and Rhythm: Normal rate.  Pulmonary:     Effort: Pulmonary effort is normal.  Neurological:     General: No focal deficit present.     Mental Status: He is alert.  Psychiatric:        Mood and Affect: Mood normal.        Behavior: Behavior normal.     Procedures  Procedures  ED Course / MDM    Medical Decision Making 72 year old male with epigastric pain.  Seen primarily by overnight team, see their note for full HPI.  Signed out pending delta troponin and ultrasound.  We do not have ultrasound until this afternoon.  Will transfer patient to [facility] for ultrasound accepted by Dr. Lockie Mola  Amount and/or Complexity of Data Reviewed Labs: ordered. Radiology: ordered.  Risk Prescription drug management.         Coral Spikes, DO 04/05/23 [phone]

## 2023-04-05 NOTE — ED Notes (Signed)
Patient declining pain medicine at this time

## 2023-04-08 NOTE — Progress Notes (Unsigned)
Cardiology Office Note:  .   Date:  04/08/2023  ID:  Rosalio Loud, DOB [birth year], MRN [id number] PCP: Sigmund Hazel, MD  [facility] Providers Cardiologist:  None { Click to update primary MD,subspecialty MD or APP then REFRESH:1}   History of Present Illness: .   Najib Locastro is a 72 y.o. male HTN, HLD, he was seen in the ED on 10/5 with epigastric abdominal pain radiating to his chest. It felt like a pressure. His BP was high.His EKG had no ischemic changes. Troponin negative x2.  He had a CT PE. He has no significant CAC. It was recommended that he see a cardiologist  ROS:  per HPI otherwise negative   Studies Reviewed: .        *** Risk Assessment/Calculations:    Physical Exam:   VS:  *** Wt Readings from Last 3 Encounters:  04/05/23 190 lb (86.2 kg)  03/31/23 190 lb (86.2 kg)  10/17/22 197 lb (89.4 kg)    GEN: Well nourished, well developed in no acute distress NECK: No JVD; No carotid bruits CARDIAC: ***RRR, no murmurs, rubs, gallops RESPIRATORY:  Clear to auscultation without rales, wheezing or rhonchi  ABDOMEN: Soft, non-tender, non-distended EXTREMITIES:  No edema; No deformity   ASSESSMENT AND PLAN: .   Epigastric Pain    {Are you ordering a CV Procedure (e.g. stress test, cath, DCCV, TEE, etc)?   Press F2        :[id number]}  Dispo: ***  Signed, Maisie Fus, MD

## 2023-04-10 ENCOUNTER — Ambulatory Visit: Payer: No Typology Code available for payment source | Attending: Internal Medicine | Admitting: Internal Medicine

## 2023-04-10 VITALS — BP 112/74 | HR 76 | Ht 73.0 in | Wt 193.2 lb

## 2023-04-10 DIAGNOSIS — R0789 Other chest pain: Secondary | ICD-10-CM | POA: Diagnosis not present

## 2023-04-10 NOTE — Patient Instructions (Signed)
Medication Instructions:  NO CHANGES   *If you need a refill on your cardiac medications before your next appointment, please call your pharmacy*   Lab Work: NONE  If you have labs (blood work) drawn today and your tests are completely normal, you will receive your results only by: MyChart Message (if you have MyChart) OR A paper copy in the mail If you have any lab test that is abnormal or we need to change your treatment, we will call you to review the results.   Testing/Procedures: NONE  Follow-Up: At [facility], you and your health needs are our priority.  As part of our continuing mission to provide you with exceptional heart care, we have created designated Provider Care Teams.  These Care Teams include your primary Cardiologist (physician) and Advanced Practice Providers (APPs -  Physician Assistants and Nurse Practitioners) who all work together to provide you with the care you need, when you need it.  We recommend signing up for the patient portal called "MyChart".  Sign up information is provided on this After Visit Summary.  MyChart is used to connect with patients for Virtual Visits (Telemedicine).  Patients are able to view lab/test results, encounter notes, upcoming appointments, etc.  Non-urgent messages can be sent to your provider as well.   To learn more about what you can do with MyChart, go to [email or web address].    Your next appointment:   Follow up needed   Provider:   Maisie Fus, MD

## 2023-04-22 ENCOUNTER — Other Ambulatory Visit: Payer: Self-pay

## 2023-04-22 ENCOUNTER — Ambulatory Visit: Payer: No Typology Code available for payment source | Attending: Neurology | Admitting: Physical Therapy

## 2023-04-22 ENCOUNTER — Encounter: Payer: Self-pay | Admitting: Physical Therapy

## 2023-04-22 DIAGNOSIS — R2689 Other abnormalities of gait and mobility: Secondary | ICD-10-CM | POA: Insufficient documentation

## 2023-04-22 DIAGNOSIS — M21371 Foot drop, right foot: Secondary | ICD-10-CM | POA: Diagnosis not present

## 2023-04-22 DIAGNOSIS — M6281 Muscle weakness (generalized): Secondary | ICD-10-CM | POA: Diagnosis not present

## 2023-04-22 NOTE — Therapy (Signed)
OUTPATIENT PHYSICAL THERAPY NEURO EVALUATION   Patient Name: Jim Mcknight MRN: [id number] DOB:September 27, 1950, 72 y.o., male Today's Date: 04/22/2023   PCP: Sigmund Hazel, MD REFERRING PROVIDER: Windell Norfolk, MD   END OF SESSION:  PT End of Session - 04/22/23 1251     Visit Number 1    Number of Visits 8    Date for PT Re-Evaluation 06/13/23    Authorization Type [employer]-aut not required; no VL    PT Start Time 0849    PT Stop Time 0931    PT Time Calculation (min) 42 min    Activity Tolerance Patient tolerated treatment well    Behavior During Therapy WFL for tasks assessed/performed             Past Medical History:  Diagnosis Date   Arthritis    Cancer (HCC)    right shoulder Melanoma removed   Elevated PSA    Hypertension    Wears glasses    Past Surgical History:  Procedure Laterality Date   ANAL FISSURE REPAIR  1978   EXCISION METACARPAL MASS Left 01/07/2022   Procedure: LEFT THUMB MASS EXCISION;  Surgeon: Betha Loa, MD;  Location: [facility];  Service: Orthopedics;  Laterality: Left;   PROSTATE BIOPSY N/A 08/22/2016   Procedure: BIOPSY TRANSRECTAL ULTRASONIC PROSTATE (TUBP);  Surgeon: Jethro Bolus, MD;  Location: [facility];  Service: Urology;  Laterality: N/A;   PROSTATE BIOPSY N/A 01/27/2020   Procedure: BIOPSY TRANSRECTAL ULTRASONIC PROSTATE (TUBP);  Surgeon: Heloise Purpura, MD;  Location: [facility];  Service: Urology;  Laterality: N/A;   TENDON EXPLORATION Left 01/07/2022   Procedure: DEBRIDEMENT INTERPHALANGEAL JOINT;  Surgeon: Betha Loa, MD;  Location: [facility];  Service: Orthopedics;  Laterality: Left;   XI ROBOTIC ASSISTED INGUINAL HERNIA REPAIR WITH MESH Bilateral 10/17/2022   Procedure: XI ROBOTIC ASSISTED INGUINAL HERNIA REPAIR WITH MESH;  Surgeon: Kinsinger, De Blanch, MD;  Location: [facility];  Service: General;  Laterality: Bilateral;  MESH   There are no problems to display for this  patient.   ONSET DATE: 03/31/2023 (MD referral)  REFERRING DIAG: M21.371 (ICD-10-CM) - Right foot drop   THERAPY DIAG:  Muscle weakness (generalized)  Other abnormalities of gait and mobility  Rationale for Evaluation and Treatment: Rehabilitation  SUBJECTIVE:                                                                                                                                                                                             SUBJECTIVE STATEMENT: Started with back pain and then R foot weakness, about 9 months ago.  Have  a hard time keeping R foot in the shoe/sandal; it will begin to slap or drag, later in the day. Pt accompanied by: self  PERTINENT HISTORY: Per Dr. Teresa Coombs notes:   past medical history of hypertension, hyperlipidemia, history of enlarged prostate who is presenting with painless foot drop for the past year.  Patient reports in past year she started noting low back pain, in the middle of low back, denies any radiation of the pain to right or left leg or buttock.  Suspect a peroneal nerve palsy causing the foot drop but due to his history of back pain and enlarged prostate, would like to rule out L5 radiculopathy. Will also obtain a EMG/NCS.   PAIN:  Are you having pain? Yes: NPRS scale: 3/10 Pain location: center of low back Pain description: sharp Aggravating factors: sit to stand, later in the day Relieving factors: nothing specific  PRECAUTIONS: Fall  RED FLAGS: None   WEIGHT BEARING RESTRICTIONS: No  FALLS: Has patient fallen in last 6 months? No  LIVING ENVIRONMENT: Lives with: lives with their spouse Lives in: House/apartment Stairs:  1 step to enter; 1 level Has following equipment at home: Single point cane  PLOF: Independent and Leisure: enjoys travelling, enjoys hiking  PATIENT GOALS: To get stronger, better control of R foot  OBJECTIVE:  Note: Objective measures were completed at Evaluation unless otherwise  noted.  DIAGNOSTIC FINDINGS: TBA-NCV and MRI are scheduled  COGNITION: Overall cognitive status: Within functional limits for tasks assessed   SENSATION: Light touch: WFL  COORDINATION: Heel to shin:  WFL BLE; alternating toe taps-slightly slowed RLE   MUSCLE TONE: WFL BLE    POSTURE: rounded shoulders, forward head, and posterior pelvic tilt  LOWER EXTREMITY ROM:   Passive R DF 15 degrees  Active  Right Eval Left Eval  Hip flexion    Hip extension    Hip abduction    Hip adduction    Hip internal rotation    Hip external rotation    Knee flexion    Knee extension    Ankle dorsiflexion 2 degrees, compensates with eversion 10  Ankle plantarflexion    Ankle inversion    Ankle eversion     (Blank rows = not tested)  LOWER EXTREMITY MMT:    MMT Right Eval Left Eval  Hip flexion 4 4  Hip extension    Hip abduction    Hip adduction    Hip internal rotation    Hip external rotation    Knee flexion 5 5  Knee extension 5 5  Ankle dorsiflexion 3- 5  Ankle plantarflexion 5 5  Ankle inversion 5 5  Ankle eversion 5 5  (Blank rows = not tested)   TRANSFERS: Assistive device utilized: None  Sit to stand: Modified independence Stand to sit: Modified independence  GAIT: Gait pattern:  slightly decreased foot clearance LLE, step through pattern, and decreased ankle dorsiflexion- Left Distance walked: 100 ft Assistive device utilized: None Level of assistance: Modified independence   FUNCTIONAL TESTS:  5 times sit to stand: 12.25 sec 10 meter walk test: 9.12 sec = 3.6 ft/sec Dynamic Gait Index: NT SLS:  LLE 10 sec, RLE 9.69 sec FGA:  28/30  [facility] PT Assessment - 04/22/23 0001       Functional Gait  Assessment   Gait assessed  Yes    Gait Level Surface Walks 20 ft in less than 7 sec but greater than 5.5 sec, uses assistive device, slower speed, mild gait deviations, or  deviates 6-10 in outside of the 12 in walkway width.   5.94   Change in Gait Speed Able  to smoothly change walking speed without loss of balance or gait deviation. Deviate no more than 6 in outside of the 12 in walkway width.    Gait with Horizontal Head Turns Performs head turns smoothly with no change in gait. Deviates no more than 6 in outside 12 in walkway width    Gait with Vertical Head Turns Performs head turns with no change in gait. Deviates no more than 6 in outside 12 in walkway width.    Gait and Pivot Turn Pivot turns safely within 3 sec and stops quickly with no loss of balance.    Step Over Obstacle Is able to step over 2 stacked shoe boxes taped together (9 in total height) without changing gait speed. No evidence of imbalance.    Gait with Narrow Base of Support Is able to ambulate for 10 steps heel to toe with no staggering.    Gait with Eyes Closed Walks 20 ft, uses assistive device, slower speed, mild gait deviations, deviates 6-10 in outside 12 in walkway width. Ambulates 20 ft in less than 9 sec but greater than 7 sec.   veers L, 7.66 sec   Ambulating Backwards Walks 20 ft, no assistive devices, good speed, no evidence for imbalance, normal gait    Steps Alternating feet, no rail.    Total Score 28               TODAY'S TREATMENT:                                                                                                                              DATE: 04/22/2023    PATIENT EDUCATION: Education details: PT eval results, POC, HEP initiated; showed patient foot-up brace that we may try in next session Person educated: Patient Education method: Explanation, Demonstration, and Handouts Education comprehension: verbalized understanding, returned demonstration, and needs further education  HOME EXERCISE PROGRAM: Access Code: [email or web address] URL: [email or web address] Date: 04/22/2023 Prepared by: [facility] - Outpatient  Rehab - [facility]  Exercises - Standing Gastroc Stretch on Step  - 1-2 x daily - 7 x weekly - 1 sets - 3 reps -  30 sec hold - Seated Gastroc Stretch with Strap  - 1-2 x daily - 7 x weekly - 1 sets - 3 reps - 30 sec hold - Sit to stand in stride stance  - 1 x daily - 7 x weekly - 3 sets - 5 reps  GOALS: Goals reviewed with patient? Yes  SHORT TERM GOALS: Target date: 05/16/2023  Pt will be independent with HEP for improved strength, flexibility in R ankle. Baseline: Goal status: INITIAL  2.  Pt will demo improved R ankle AROM to 10 degrees. Baseline:  Goal status: INITIAL   LONG TERM GOALS: Target date: 06/13/2023  Pt will be independent with HEP for improved  flexibility, strength, gait. Baseline:  Goal status: INITIAL  2.  Pt will improve R ankle dorsiflexion to at least 3+/5 for improved strength. Baseline:  Goal status: INITIAL  3.  Pt will report improved gait with 50% less foot slap, later in the day/evening, for improved strength and gait. Baseline:  Goal status: INITIAL   ASSESSMENT:  CLINICAL IMPRESSION: Patient is a 72 y.o. male who was seen today for physical therapy evaluation and treatment for R foot drop.   He reports onset of back pain and R foot/ankle weakness about 9 months ago; he has recently seen neurologist, who is ordering additional testing.  Pt does report that R foot slap and decreased R foot clearance comes on later in the day, typically.  He does present with decreased flexibility, decreased ankle dorsiflexor strength, which is contributing to decreased timing, coordination, and stability with gait.  He is active, enjoying travel and hiking.  He will benefit from skilled PT to address the above stated deficits to improve overall strength and functional mobility.  OBJECTIVE IMPAIRMENTS: Abnormal gait, decreased mobility, difficulty walking, decreased ROM, decreased strength, and impaired flexibility.   ACTIVITY LIMITATIONS: locomotion level  PARTICIPATION LIMITATIONS: community activity and hiking, travelling  PERSONAL FACTORS: 1-2 comorbidities: see above   are also affecting patient's functional outcome.   REHAB POTENTIAL: Good  CLINICAL DECISION MAKING: Stable/uncomplicated  EVALUATION COMPLEXITY: Low  PLAN:  PT FREQUENCY: 1x/week  PT DURATION: 6 weeks including eval visit  PLANNED INTERVENTIONS: 97110-Therapeutic exercises, 97530- Therapeutic activity, [id number]- Neuromuscular re-education, 97535- Self Care, [id number]- Manual therapy, [id number]- Gait training, [phone]- Orthotic Fit/training, 97014- Electrical stimulation (unattended), [phone]- Electrical stimulation (manual), Patient/Family education, and Taping  PLAN FOR NEXT SESSION: Review initial HEP and progress stretching as appropriate; work on R dorsiflexor strengthening and update HEP, try foot-up brace   Eleen Litz W., PT 04/22/2023, 12:55 PM   Outpatient Rehab at [facility] [address] [place], [place] [address] Phone # [phone] Fax # [phone]

## 2023-04-24 ENCOUNTER — Encounter (HOSPITAL_BASED_OUTPATIENT_CLINIC_OR_DEPARTMENT_OTHER): Payer: Self-pay | Admitting: Emergency Medicine

## 2023-04-24 ENCOUNTER — Observation Stay (HOSPITAL_BASED_OUTPATIENT_CLINIC_OR_DEPARTMENT_OTHER)
Admission: EM | Admit: 2023-04-24 | Discharge: 2023-04-26 | Disposition: A | Discharge status: Home / Self Care | Payer: No Typology Code available for payment source | Attending: Family Medicine | Admitting: Family Medicine

## 2023-04-24 DIAGNOSIS — R1013 Epigastric pain: Secondary | ICD-10-CM | POA: Diagnosis present

## 2023-04-24 DIAGNOSIS — K8012 Calculus of gallbladder with acute and chronic cholecystitis without obstruction: Secondary | ICD-10-CM | POA: Diagnosis not present

## 2023-04-24 DIAGNOSIS — Z85828 Personal history of other malignant neoplasm of skin: Secondary | ICD-10-CM | POA: Diagnosis not present

## 2023-04-24 DIAGNOSIS — Z7982 Long term (current) use of aspirin: Secondary | ICD-10-CM | POA: Diagnosis not present

## 2023-04-24 DIAGNOSIS — Z79899 Other long term (current) drug therapy: Secondary | ICD-10-CM | POA: Insufficient documentation

## 2023-04-24 DIAGNOSIS — I10 Essential (primary) hypertension: Secondary | ICD-10-CM | POA: Diagnosis not present

## 2023-04-24 DIAGNOSIS — K810 Acute cholecystitis: Secondary | ICD-10-CM | POA: Diagnosis not present

## 2023-04-24 DIAGNOSIS — E785 Hyperlipidemia, unspecified: Secondary | ICD-10-CM | POA: Diagnosis present

## 2023-04-24 NOTE — ED Triage Notes (Addendum)
Pt c/o CP and emesis x2 since 2100 tonight. Pt reports similar s/s 2w ago and was seen. Pt reports radiates to back which did not happen before. Taken tums without relief  Began feeling lightheaded and dizzy in triage

## 2023-04-25 ENCOUNTER — Observation Stay (HOSPITAL_COMMUNITY): Payer: No Typology Code available for payment source

## 2023-04-25 ENCOUNTER — Emergency Department (HOSPITAL_BASED_OUTPATIENT_CLINIC_OR_DEPARTMENT_OTHER): Payer: No Typology Code available for payment source

## 2023-04-25 ENCOUNTER — Other Ambulatory Visit: Payer: Self-pay

## 2023-04-25 DIAGNOSIS — R112 Nausea with vomiting, unspecified: Secondary | ICD-10-CM | POA: Diagnosis not present

## 2023-04-25 DIAGNOSIS — E785 Hyperlipidemia, unspecified: Secondary | ICD-10-CM | POA: Diagnosis present

## 2023-04-25 DIAGNOSIS — K810 Acute cholecystitis: Secondary | ICD-10-CM | POA: Diagnosis not present

## 2023-04-25 DIAGNOSIS — K838 Other specified diseases of biliary tract: Secondary | ICD-10-CM | POA: Diagnosis not present

## 2023-04-25 DIAGNOSIS — I10 Essential (primary) hypertension: Secondary | ICD-10-CM | POA: Diagnosis present

## 2023-04-25 DIAGNOSIS — R1011 Right upper quadrant pain: Secondary | ICD-10-CM | POA: Diagnosis not present

## 2023-04-25 DIAGNOSIS — R1013 Epigastric pain: Secondary | ICD-10-CM | POA: Diagnosis not present

## 2023-04-25 DIAGNOSIS — I7121 Aneurysm of the ascending aorta, without rupture: Secondary | ICD-10-CM | POA: Diagnosis not present

## 2023-04-25 DIAGNOSIS — K828 Other specified diseases of gallbladder: Secondary | ICD-10-CM | POA: Diagnosis not present

## 2023-04-25 DIAGNOSIS — R079 Chest pain, unspecified: Secondary | ICD-10-CM | POA: Diagnosis not present

## 2023-04-25 DIAGNOSIS — K5730 Diverticulosis of large intestine without perforation or abscess without bleeding: Secondary | ICD-10-CM | POA: Diagnosis not present

## 2023-04-25 DIAGNOSIS — R1110 Vomiting, unspecified: Secondary | ICD-10-CM | POA: Diagnosis not present

## 2023-04-25 DIAGNOSIS — K8001 Calculus of gallbladder with acute cholecystitis with obstruction: Secondary | ICD-10-CM | POA: Diagnosis not present

## 2023-04-25 LAB — COMPREHENSIVE METABOLIC PANEL
ALT: 23 U/L (ref 0–44)
AST: 21 U/L (ref 15–41)
Albumin: 4 g/dL (ref 3.5–5.0)
Alkaline Phosphatase: 77 U/L (ref 38–126)
Anion gap: 10 (ref 5–15)
BUN: 16 mg/dL (ref 8–23)
CO2: 25 mmol/L (ref 22–32)
Calcium: 9.2 mg/dL (ref 8.9–10.3)
Chloride: 99 mmol/L (ref 98–111)
Creatinine, Ser: 1.26 mg/dL — ABNORMAL HIGH (ref 0.61–1.24)
GFR, Estimated: >60 mL/min (ref >60)
Glucose, Bld: 136 mg/dL — ABNORMAL HIGH (ref 70–99)
Potassium: 3.3 mmol/L — ABNORMAL LOW (ref 3.5–5.1)
Sodium: 134 mmol/L — ABNORMAL LOW (ref 135–145)
Total Bilirubin: 0.6 mg/dL (ref 0.3–1.2)
Total Protein: 7.3 g/dL (ref 6.5–8.1)

## 2023-04-25 LAB — CBC
HCT: 42.4 % (ref 39.0–52.0)
Hemoglobin: 13.9 g/dL (ref 13.0–17.0)
MCH: 28.1 pg (ref 26.0–34.0)
MCHC: 32.8 g/dL (ref 30.0–36.0)
MCV: 85.8 fL (ref 80.0–100.0)
Platelets: 451 10*3/uL — ABNORMAL HIGH (ref 150–400)
RBC: 4.94 MIL/uL (ref 4.22–5.81)
RDW: 14.8 % (ref 11.5–15.5)
WBC: 14.9 10*3/uL — ABNORMAL HIGH (ref 4.0–10.5)
nRBC: 0 % (ref 0.0–0.2)

## 2023-04-25 LAB — LACTIC ACID, PLASMA
Lactic Acid, Venous: 1.6 mmol/L (ref 0.5–1.9)
Lactic Acid, Venous: 2.8 mmol/L (ref 0.5–1.9)

## 2023-04-25 LAB — TROPONIN I (HIGH SENSITIVITY)
Troponin I (High Sensitivity): 4 ng/L (ref <18)
Troponin I (High Sensitivity): 5 ng/L (ref <18)

## 2023-04-25 LAB — LIPASE, BLOOD: Lipase: 57 U/L — ABNORMAL HIGH (ref 11–51)

## 2023-04-25 MED ORDER — HYDRALAZINE HCL 20 MG/ML IJ SOLN: 10.0000 mg | Freq: Four times a day (QID) | INTRAMUSCULAR | Status: DC | PRN

## 2023-04-25 MED ORDER — HYDROMORPHONE HCL 1 MG/ML IJ SOLN
0.5000 mg | INTRAMUSCULAR | Status: DC | PRN
  Administered 2023-04-26: 1 mg via INTRAVENOUS
  Filled 2023-04-25 (×2): qty 1

## 2023-04-25 MED ORDER — SODIUM CHLORIDE 0.9 % IV SOLN
2.0000 g | Freq: Once | INTRAVENOUS | Status: AC
  Administered 2023-04-25: 2 g via INTRAVENOUS
  Filled 2023-04-25: qty 20

## 2023-04-25 MED ORDER — HYDROCHLOROTHIAZIDE 12.5 MG PO TABS
12.5000 mg | ORAL_TABLET | Freq: Every day | ORAL | Status: DC
  Administered 2023-04-25 – 2023-04-26 (×2): 12.5 mg via ORAL
  Filled 2023-04-25 (×2): qty 1

## 2023-04-25 MED ORDER — ONDANSETRON HCL 4 MG/2ML IJ SOLN
4.0000 mg | Freq: Four times a day (QID) | INTRAMUSCULAR | Status: DC | PRN
  Administered 2023-04-25 – 2023-04-26 (×2): 4 mg via INTRAVENOUS
  Filled 2023-04-25 (×2): qty 2

## 2023-04-25 MED ORDER — SODIUM CHLORIDE 0.9 % IV BOLUS
1000.0000 mL | Freq: Once | INTRAVENOUS | Status: AC
  Administered 2023-04-25: 1000 mL via INTRAVENOUS

## 2023-04-25 MED ORDER — LISINOPRIL 20 MG PO TABS
20.0000 mg | ORAL_TABLET | Freq: Every day | ORAL | Status: DC
  Administered 2023-04-25 – 2023-04-26 (×2): 20 mg via ORAL
  Filled 2023-04-25 (×2): qty 1

## 2023-04-25 MED ORDER — ACETAMINOPHEN 325 MG PO TABS: 650.0000 mg | ORAL_TABLET | Freq: Four times a day (QID) | ORAL | Status: DC | PRN

## 2023-04-25 MED ORDER — ATORVASTATIN CALCIUM 10 MG PO TABS
10.0000 mg | ORAL_TABLET | Freq: Every day | ORAL | Status: DC
  Administered 2023-04-25 – 2023-04-26 (×2): 10 mg via ORAL
  Filled 2023-04-25 (×2): qty 1

## 2023-04-25 MED ORDER — SODIUM CHLORIDE 0.9% FLUSH
3.0000 mL | Freq: Two times a day (BID) | INTRAVENOUS | Status: DC
Start: 2023-04-25 — End: 2023-04-26
  Administered 2023-04-25 – 2023-04-26 (×3): 3 mL via INTRAVENOUS

## 2023-04-25 MED ORDER — POTASSIUM CHLORIDE CRYS ER 20 MEQ PO TBCR
40.0000 meq | EXTENDED_RELEASE_TABLET | Freq: Once | ORAL | Status: AC
  Administered 2023-04-25: 40 meq via ORAL
  Filled 2023-04-25: qty 2

## 2023-04-25 MED ORDER — HYDROCODONE-ACETAMINOPHEN 5-325 MG PO TABS
1.0000 | ORAL_TABLET | Freq: Four times a day (QID) | ORAL | Status: DC | PRN
  Administered 2023-04-25: 1 via ORAL
  Filled 2023-04-25 (×2): qty 1

## 2023-04-25 MED ORDER — ACETAMINOPHEN 650 MG RE SUPP: 650.0000 mg | Freq: Four times a day (QID) | RECTAL | Status: DC | PRN

## 2023-04-25 MED ORDER — IOHEXOL 350 MG/ML SOLN
100.0000 mL | Freq: Once | INTRAVENOUS | Status: AC | PRN
  Administered 2023-04-25: 100 mL via INTRAVENOUS

## 2023-04-25 MED ORDER — HYDRALAZINE HCL 20 MG/ML IJ SOLN: 10.0000 mg | INTRAMUSCULAR | Status: DC | PRN

## 2023-04-25 MED ORDER — KETOROLAC TROMETHAMINE 30 MG/ML IJ SOLN
30.0000 mg | Freq: Once | INTRAMUSCULAR | Status: AC
  Administered 2023-04-25: 30 mg via INTRAVENOUS
  Filled 2023-04-25: qty 1

## 2023-04-25 MED ORDER — METRONIDAZOLE 500 MG/100ML IV SOLN
500.0000 mg | Freq: Once | INTRAVENOUS | Status: AC
  Administered 2023-04-25: 500 mg via INTRAVENOUS
  Filled 2023-04-25: qty 100

## 2023-04-25 MED ORDER — METOPROLOL SUCCINATE ER 50 MG PO TB24
50.0000 mg | ORAL_TABLET | Freq: Every day | ORAL | Status: DC
  Administered 2023-04-25 – 2023-04-26 (×2): 50 mg via ORAL
  Filled 2023-04-25 (×2): qty 2

## 2023-04-25 MED ORDER — HYDROMORPHONE HCL 1 MG/ML IJ SOLN
1.0000 mg | Freq: Once | INTRAMUSCULAR | Status: AC
Start: 2023-04-25 — End: 2023-04-25
  Administered 2023-04-25: 1 mg via INTRAVENOUS
  Filled 2023-04-25: qty 1

## 2023-04-25 MED ORDER — SODIUM CHLORIDE 0.9 % IV SOLN
2.0000 g | INTRAVENOUS | Status: DC
  Administered 2023-04-25: 2 g via INTRAVENOUS
  Filled 2023-04-25: qty 20

## 2023-04-25 MED ORDER — ONDANSETRON HCL 4 MG/2ML IJ SOLN
4.0000 mg | Freq: Once | INTRAMUSCULAR | Status: AC
  Administered 2023-04-25: 4 mg via INTRAVENOUS
  Filled 2023-04-25: qty 2

## 2023-04-25 MED ORDER — HYDRALAZINE HCL 20 MG/ML IJ SOLN
10.0000 mg | Freq: Once | INTRAMUSCULAR | Status: AC
  Administered 2023-04-25: 10 mg via INTRAVENOUS
  Filled 2023-04-25: qty 1

## 2023-04-25 MED ORDER — POLYETHYLENE GLYCOL 3350 17 G PO PACK: 17.0000 g | PACK | Freq: Every day | ORAL | Status: DC | PRN

## 2023-04-25 MED ORDER — LISINOPRIL-HYDROCHLOROTHIAZIDE 20-12.5 MG PO TABS: 1.0000 | ORAL_TABLET | Freq: Every morning | ORAL | Status: DC

## 2023-04-25 NOTE — ED Provider Notes (Signed)
He is continuing to await a bed at [facility] [address]  Had an episode of pain this morning which improved with Toradol.  Reports some mild continuing soreness and does have continued tenderness on exam.  He is hemodynamically stable not in any acute distress.  Added Flagyl to his Rocephin he had already received.  Given he is continuing to await a bed with concern for possible cholecystitis, will transfer to the emergency department for continued hospitalist admission and surgery to evaluate.  Dr. Rodena Medin is excepting in the emergency department.  Dr. Katrinka Blazing hospitalist is aware of the transfer, as well as Marisue Ivan of general surgery.  Plan for surgery to evaluate and determine whether he will require surgery or HIDA scan for further evaluation.  Hospitalist and surgery aware.    Alvira Monday, MD 04/25/23 1039

## 2023-04-25 NOTE — ED Notes (Signed)
Placed on 2LPM [place] while resting.

## 2023-04-25 NOTE — ED Notes (Signed)
ED TO INPATIENT HANDOFF REPORT  ED Nurse Name and Phone #: Vernona Rieger [phone]  S Name/Age/Gender Jim Mcknight 72 y.o. male Room/Bed: 002C/002C  Code Status   Code Status: Not on file  Home/SNF/Other Home Patient oriented to: self, place, time, and situation Is this baseline? Yes   Triage Complete: Triage complete  Chief Complaint Acute cholecystitis [K81.0]  Triage Note Pt c/o CP and emesis x2 since 2100 tonight. Pt reports similar s/s 2w ago and was seen. Pt reports radiates to back which did not happen before. Taken tums without relief  Began feeling lightheaded and dizzy in triage   Allergies No Known Allergies  Level of Care/Admitting Diagnosis ED Disposition     ED Disposition  Admit   Condition  --   Comment  Hospital Area: [facility] [[id number]]  Level of Care: Med-Surg [16]  Interfacility transfer: Yes  May place patient in observation at [facility] or [facility] if equivalent level of care is available:: Yes  Covid Evaluation: Asymptomatic - no recent exposure (last 10 days) testing not required  Diagnosis: Acute cholecystitis [575.0.ICD-9-CM]  Admitting Physician: Arlean Hopping [[id number]]  Attending Physician: Arlean Hopping [[id number]]          B Medical/Surgery History Past Medical History:  Diagnosis Date   Arthritis    Cancer (HCC)    right shoulder Melanoma removed   Elevated PSA    Hypertension    Wears glasses    Past Surgical History:  Procedure Laterality Date   ANAL FISSURE REPAIR  1978   EXCISION METACARPAL MASS Left 01/07/2022   Procedure: LEFT THUMB MASS EXCISION;  Surgeon: Betha Loa, MD;  Location: [facility];  Service: Orthopedics;  Laterality: Left;   PROSTATE BIOPSY N/A 08/22/2016   Procedure: BIOPSY TRANSRECTAL ULTRASONIC PROSTATE (TUBP);  Surgeon: Jethro Bolus, MD;  Location: [facility];  Service: Urology;  Laterality: N/A;   PROSTATE BIOPSY N/A 01/27/2020    Procedure: BIOPSY TRANSRECTAL ULTRASONIC PROSTATE (TUBP);  Surgeon: Heloise Purpura, MD;  Location: [facility];  Service: Urology;  Laterality: N/A;   TENDON EXPLORATION Left 01/07/2022   Procedure: DEBRIDEMENT INTERPHALANGEAL JOINT;  Surgeon: Betha Loa, MD;  Location: [facility];  Service: Orthopedics;  Laterality: Left;   XI ROBOTIC ASSISTED INGUINAL HERNIA REPAIR WITH MESH Bilateral 10/17/2022   Procedure: XI ROBOTIC ASSISTED INGUINAL HERNIA REPAIR WITH MESH;  Surgeon: Kinsinger, De Blanch, MD;  Location: [facility];  Service: General;  Laterality: Bilateral;  MESH     A IV Location/Drains/Wounds Patient Lines/Drains/Airways Status     Active Line/Drains/Airways     Name Placement date Placement time Site Days   Peripheral IV 04/25/23 18 G Right Antecubital 04/25/23  0011  Antecubital  less than 1   Incision - 4 Ports Abdomen 1: Left;Lateral 2: Umbilicus 3: Right;Lateral 4: Right;Mid 10/17/22  0800  -- 190            Intake/Output Last 24 hours  Intake/Output Summary (Last 24 hours) at 04/25/2023 1241 Last data filed at 04/25/2023 0417 Gross per 24 hour  Intake 984.2 ml  Output --  Net 984.2 ml    Labs/Imaging Results for orders placed or performed during the hospital encounter of 04/24/23 (from the past 48 hour(s))  CBC     Status: Abnormal   Collection Time: 04/24/23 11:47 PM  Result Value Ref Range   WBC 14.9 (H) 4.0 - 10.5 K/uL   RBC 4.94 4.22 - 5.81 MIL/uL   Hemoglobin 13.9  13.0 - 17.0 g/dL   HCT 86.5 78.4 - 69.6 %   MCV 85.8 80.0 - 100.0 fL   MCH 28.1 26.0 - 34.0 pg   MCHC 32.8 30.0 - 36.0 g/dL   RDW 29.5 28.4 - 13.2 %   Platelets 451 (H) 150 - 400 K/uL   nRBC 0.0 0.0 - 0.2 %    Comment: Performed at [facility], [address] [place], [place] [address]  Troponin I (High Sensitivity)     Status: None   Collection Time: 04/24/23 11:47 PM  Result Value Ref Range   Troponin I (High Sensitivity) 4 <18 ng/L    Comment:  (NOTE) Elevated high sensitivity troponin I (hsTnI) values and significant  changes across serial measurements may suggest ACS but many other  chronic and acute conditions are known to elevate hsTnI results.  Refer to the "Links" section for chest pain algorithms and additional  guidance. Performed at [facility], [address], [place], [place] [address]   Comprehensive metabolic panel     Status: Abnormal   Collection Time: 04/25/23 12:03 AM  Result Value Ref Range   Sodium 134 (L) 135 - 145 mmol/L   Potassium 3.3 (L) 3.5 - 5.1 mmol/L   Chloride 99 98 - 111 mmol/L   CO2 25 22 - 32 mmol/L   Glucose, Bld 136 (H) 70 - 99 mg/dL    Comment: Glucose reference range applies only to samples taken after fasting for at least 8 hours.   BUN 16 8 - 23 mg/dL   Creatinine, Ser 6.64 (H) 0.61 - 1.24 mg/dL   Calcium 9.2 8.9 - 40.3 mg/dL   Total Protein 7.3 6.5 - 8.1 g/dL   Albumin 4.0 3.5 - 5.0 g/dL   AST 21 15 - 41 U/L   ALT 23 0 - 44 U/L   Alkaline Phosphatase 77 38 - 126 U/L   Total Bilirubin 0.6 0.3 - 1.2 mg/dL   GFR, Estimated >47 >42 mL/min    Comment: (NOTE) Calculated using the CKD-EPI Creatinine Equation (2021)    Anion gap 10 5 - 15    Comment: Performed at [facility], [address], [place], [place] [address]  Lipase, blood     Status: Abnormal   Collection Time: 04/25/23 12:03 AM  Result Value Ref Range   Lipase 57 (H) 11 - 51 U/L    Comment: Performed at [facility], [address], [place], [place] [address]  Lactic acid, plasma     Status: Abnormal   Collection Time: 04/25/23 12:03 AM  Result Value Ref Range   Lactic Acid, Venous 2.8 (HH) 0.5 - 1.9 mmol/L    Comment: CRITICAL RESULT CALLED TO, READ BACK BY AND VERIFIED WITH: CALLED TO BETH CRABTREE RN 04/25/23 0049 BY Bartolo Darter BOWMAN Performed at [facility], [address] [place], [place] [address]   Lactic acid, plasma     Status:  None   Collection Time: 04/25/23  1:58 AM  Result Value Ref Range   Lactic Acid, Venous 1.6 0.5 - 1.9 mmol/L    Comment: Performed at [facility], [address] [place], [place] [address]  Troponin I (High Sensitivity)     Status: None   Collection Time: 04/25/23  1:58 AM  Result Value Ref Range   Troponin I (High Sensitivity) 5 <18 ng/L    Comment: (NOTE) Elevated high sensitivity troponin I (hsTnI) values and significant  changes across serial measurements may suggest ACS  but many other  chronic and acute conditions are known to elevate hsTnI results.  Refer to the "Links" section for chest pain algorithms and additional  guidance. Performed at [facility], [address], [place], [place] [address]    CT Angio Chest/Abd/Pel for Dissection W and/or W/WO  Result Date: 04/25/2023 CLINICAL DATA:  Acute aortic syndrome suspected.  Emesis. EXAM: CT ANGIOGRAPHY CHEST, ABDOMEN AND PELVIS TECHNIQUE: Non-contrast CT of the chest was initially obtained. Multidetector CT imaging through the chest, abdomen and pelvis was performed using the standard protocol during bolus administration of intravenous contrast. Multiplanar reconstructed images and MIPs were obtained and reviewed to evaluate the vascular anatomy. RADIATION DOSE REDUCTION: This exam was performed according to the departmental dose-optimization program which includes automated exposure control, adjustment of the mA and/or kV according to patient size and/or use of iterative reconstruction technique. CONTRAST:  OMNIPAQUE IOHEXOL 350 MG/ML SOLN COMPARISON:  CT angiogram chest abdomen and pelvis 04/05/2023. FINDINGS: CTA CHEST FINDINGS Cardiovascular: The ascending aorta is mildly dilated measuring up 4 cm, unchanged. There is no evidence for aortic dissection. Heart size is within normal limits. There is no central pulmonary embolism or pericardial effusion. Mediastinum/Nodes: No enlarged  mediastinal, hilar, or axillary lymph nodes. Thyroid gland, trachea, and esophagus demonstrate no significant findings. Lungs/Pleura: There is linear atelectasis in the left lung base. The lungs are otherwise clear. There is no pleural effusion or pneumothorax. Musculoskeletal: No chest wall abnormality. No acute or significant osseous findings. Review of the MIP images confirms the above findings. CTA ABDOMEN AND PELVIS FINDINGS VASCULAR Aorta: Normal caliber aorta without aneurysm, dissection, vasculitis or significant stenosis. There is mild calcified atherosclerotic disease throughout the aorta. Celiac: Patent without evidence of aneurysm, dissection, vasculitis or significant stenosis. SMA: Patent without evidence of aneurysm, dissection, vasculitis or significant stenosis. Renals: Both renal arteries are patent without evidence of aneurysm, dissection, vasculitis, fibromuscular dysplasia or significant stenosis. IMA: Patent without evidence of aneurysm, dissection, vasculitis or significant stenosis. Inflow: Patent without evidence of aneurysm, dissection, vasculitis or significant stenosis. Veins: No obvious venous abnormality within the limitations of this arterial phase study. Review of the MIP images confirms the above findings. NON-VASCULAR Hepatobiliary: There is gallbladder wall thickening with mild surrounding inflammatory stranding. No biliary ductal dilatation. The liver is within normal limits. Pancreas: Unremarkable. No pancreatic ductal dilatation or surrounding inflammatory changes. Spleen: Normal in size without focal abnormality. Adrenals/Urinary Tract: Bladder is distended. The kidneys and adrenal glands are within normal limits. Stomach/Bowel: Stomach is within normal limits. Appendix appears normal. No evidence of bowel wall thickening, distention, or inflammatory changes. There is sigmoid colon diverticulosis. Lymphatic: No enlarged lymph nodes are identified. Reproductive: Prostate gland  is prominent in size. Other: No abdominal wall hernia or abnormality. No abdominopelvic ascites. Musculoskeletal: No acute findings. Review of the MIP images confirms the above findings. IMPRESSION: 1. No evidence for aortic dissection or aneurysm. 2. 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 Recommend annual imaging followup by CTA or MRA. This recommendation follows 2010 ACCF/AHA/AATS/ACR/ASA/SCA/SCAI/SIR/STS/SVM Guidelines for the Diagnosis and Management of Patients with Thoracic Aortic Disease. Circulation. 2010; 121: F643-P295. Aortic aneurysm NOS (ICD10-I71.9) 3. Gallbladder wall thickening with mild surrounding inflammatory stranding worrisome for acute cholecystitis. 4. Distended bladder. 5. Sigmoid colon diverticulosis. Aortic Atherosclerosis (ICD10-I70.0). Electronically Signed   By: Darliss Cheney M.D.   On: 04/25/2023 03:25   DG Chest Port 1 View  Result Date: 04/25/2023 CLINICAL DATA:  Chest pain and emesis. EXAM: PORTABLE CHEST 1  VIEW COMPARISON:  04/05/2023. FINDINGS: The heart size and mediastinal contours are stable. The pulmonary vasculature is mildly distended. No consolidation, effusion, or pneumothorax. No acute osseous abnormality. IMPRESSION: Mildly distended pulmonary vasculature. Electronically Signed   By: Thornell Sartorius M.D.   On: 04/25/2023 00:15    Pending Labs Unresulted Labs (From admission, onward)    None       Vitals/Pain Today's Vitals   04/25/23 1000 04/25/23 1230 04/25/23 1231 04/25/23 1232  BP: 126/80 128/83    Pulse: 90 81    Resp: 17 19    Temp:   98.4 F (36.9 C)   TempSrc:   Oral   SpO2: 96% 100%    Weight:    87.6 kg  Height:    6\' 1"  (1.854 m)  PainSc:        Isolation Precautions No active isolations  Medications Medications  hydrALAZINE (APRESOLINE) injection 10 mg (has no administration in time range)  HYDROmorphone (DILAUDID) injection 1 mg (1 mg Intravenous Given 04/25/23 0024)  ondansetron (ZOFRAN) injection 4 mg (4 mg  Intravenous Given 04/25/23 0023)  sodium chloride 0.9 % bolus 1,000 mL (0 mLs Intravenous Stopped 04/25/23 0158)  iohexol (OMNIPAQUE) 350 MG/ML injection 100 mL (100 mLs Intravenous Contrast Given 04/25/23 0256)  cefTRIAXone (ROCEPHIN) 2 g in sodium chloride 0.9 % 100 mL IVPB (0 g Intravenous Stopped 04/25/23 0417)  hydrALAZINE (APRESOLINE) injection 10 mg (10 mg Intravenous Given 04/25/23 0340)  ketorolac (TORADOL) 30 MG/ML injection 30 mg (30 mg Intravenous Given 04/25/23 0831)  metroNIDAZOLE (FLAGYL) IVPB 500 mg (0 mg Intravenous Stopped 04/25/23 0935)    Mobility walks     Focused Assessments Cardiac Assessment Handoff:  Cardiac Rhythm: Normal sinus rhythm No results found for: "CKTOTAL", "CKMB", "CKMBINDEX", "TROPONINI" No results found for: "DDIMER" Does the Patient currently have chest pain? Yes    R Recommendations: See Admitting Provider Note  Report given to:   Additional Notes: Self care

## 2023-04-25 NOTE — Consult Note (Signed)
Jim Mcknight [birth year]  [id number].    Requesting MD: Dalene Seltzer, MD  Chief Complaint/Reason for Consult: RUQ/epigastric pain  HPI:  Jim Mcknight is a 72 y/o M who presents with abdominal pain and back pain. He states that last night around 0900 the pain started in his back and then moved to his epigastric region. He tried advil, tums, belching, and having BM without relief of pain. He also reports nausea and two episodes of vomiting at home. He reports two similar, less severe episodes last month. He was seen in the ED 10/5 for one of these attacks where ACS/cardiac workup was negative, CTA negative for aortic dissection (Worked up due to his HTN), he had a CT that showed gallbladder wall thickening and a RUQ ultrasound that was negative.  The patient denies a known history of gastritis or gastric ulcers. He reports black stool a while back when he was taking a certain cold medicine that "always does that".    His wife is at the bedside. Patient denies tobacco or alcohol use. He underwent bilateral inguinal hernia repair 11/07/22 and denies any complication under anesthesia or post-operatively. He denies the use of blood thinners.   ROS: Review of Systems  All other systems reviewed and are negative.   History reviewed. No pertinent family history.  Past Medical History:  Diagnosis Date   Arthritis    Cancer (HCC)    right shoulder Melanoma removed   Elevated PSA    Hypertension    Wears glasses     Past Surgical History:  Procedure Laterality Date   ANAL FISSURE REPAIR  1978   EXCISION METACARPAL MASS Left 01/07/2022   Procedure: LEFT THUMB MASS EXCISION;  Surgeon: Betha Loa, MD;  Location: [facility];  Service: Orthopedics;  Laterality: Left;   PROSTATE BIOPSY N/A 08/22/2016   Procedure: BIOPSY TRANSRECTAL ULTRASONIC PROSTATE (TUBP);  Surgeon: Jethro Bolus, MD;  Location: [facility];  Service: Urology;  Laterality: N/A;    PROSTATE BIOPSY N/A 01/27/2020   Procedure: BIOPSY TRANSRECTAL ULTRASONIC PROSTATE (TUBP);  Surgeon: Heloise Purpura, MD;  Location: [facility];  Service: Urology;  Laterality: N/A;   TENDON EXPLORATION Left 01/07/2022   Procedure: DEBRIDEMENT INTERPHALANGEAL JOINT;  Surgeon: Betha Loa, MD;  Location: [facility];  Service: Orthopedics;  Laterality: Left;   XI ROBOTIC ASSISTED INGUINAL HERNIA REPAIR WITH MESH Bilateral 10/17/2022   Procedure: XI ROBOTIC ASSISTED INGUINAL HERNIA REPAIR WITH MESH;  Surgeon: Kinsinger, De Blanch, MD;  Location: [facility];  Service: General;  Laterality: Bilateral;  MESH    Social History:  reports that he has never smoked. He has never used smokeless tobacco. He reports that he does not drink alcohol and does not use drugs.  Allergies: No Known Allergies  Medications Prior to Admission  Medication Sig Dispense Refill   aspirin 81 MG chewable tablet Chew 81 mg by mouth daily.     atorvastatin (LIPITOR) 10 MG tablet Take 10 mg by mouth daily.     Chlorpheniramine-Acetaminophen (CORICIDIN HBP COLD/FLU PO) Take 1 tablet by mouth 2 (two) times daily as needed (Cold/flu).     Cholecalciferol (VITAMIN D-3) 1000 units CAPS Take 1,000 Units by mouth daily.      Coenzyme Q10 (CO Q 10) 100 MG CAPS Take 100 mg by mouth daily.     ECHINACEA PO Take 1 capsule by mouth daily as needed.     folic acid (FOLVITE) 800 MCG tablet Take 800 mcg by mouth  daily.     lisinopril-hydrochlorothiazide (PRINZIDE,ZESTORETIC) 20-12.5 MG tablet Take 1 tablet by mouth every morning.     metoprolol succinate (TOPROL-XL) 50 MG 24 hr tablet Take 50 mg by mouth daily.     vitamin C (ASCORBIC ACID) 500 MG tablet Take 500 mg by mouth daily.       Physical Exam: Blood pressure (!) 125/97, pulse 89, temperature 98.4 F (36.9 C), temperature source Oral, resp. rate 19, height 6\' 1"  (1.854 m), weight 87.6 kg, SpO2 98%. General: Pleasant white male  laying on hospital bed, appears stated age,  NAD. HEENT: head -normocephalic, atraumatic; Eyes: PERRLA, no conjunctival injection, anicteric sclerae Neck- Trachea is midline, no thyromegaly or JVD appreciated.  CV- RRR, no lower extremity edema  Pulm- breathing is non-labored ORA Abd- soft, mild TTP RUQ without rebound tenderness or guarding, appropriate bowel sounds in 4 quadrants, no masses, hernias, or organomegaly. GU- deferred  MSK- UE/LE symmetrical, no cyanosis, clubbing, or edema. Neuro- CN II-XII grossly in tact, no paresthesias. Psych- Alert and Oriented x3 with appropriate affect Skin: warm and dry, no rashes or lesions   Results for orders placed or performed during the hospital encounter of 04/24/23 (from the past 48 hour(s))  CBC     Status: Abnormal   Collection Time: 04/24/23 11:47 PM  Result Value Ref Range   WBC 14.9 (H) 4.0 - 10.5 K/uL   RBC 4.94 4.22 - 5.81 MIL/uL   Hemoglobin 13.9 13.0 - 17.0 g/dL   HCT 96.2 95.2 - 84.1 %   MCV 85.8 80.0 - 100.0 fL   MCH 28.1 26.0 - 34.0 pg   MCHC 32.8 30.0 - 36.0 g/dL   RDW 32.4 40.1 - 02.7 %   Platelets 451 (H) 150 - 400 K/uL   nRBC 0.0 0.0 - 0.2 %    Comment: Performed at [facility], [address] [place], [place] [address]  Troponin I (High Sensitivity)     Status: None   Collection Time: 04/24/23 11:47 PM  Result Value Ref Range   Troponin I (High Sensitivity) 4 <18 ng/L    Comment: (NOTE) Elevated high sensitivity troponin I (hsTnI) values and significant  changes across serial measurements may suggest ACS but many other  chronic and acute conditions are known to elevate hsTnI results.  Refer to the "Links" section for chest pain algorithms and additional  guidance. Performed at [facility], [address], [place], [place] [address]   Comprehensive metabolic panel     Status: Abnormal   Collection Time: 04/25/23 12:03 AM  Result Value Ref Range   Sodium 134 (L) 135 - 145 mmol/L   Potassium 3.3 (L) 3.5 - 5.1  mmol/L   Chloride 99 98 - 111 mmol/L   CO2 25 22 - 32 mmol/L   Glucose, Bld 136 (H) 70 - 99 mg/dL    Comment: Glucose reference range applies only to samples taken after fasting for at least 8 hours.   BUN 16 8 - 23 mg/dL   Creatinine, Ser 7.42 (H) 0.61 - 1.24 mg/dL   Calcium 9.2 8.9 - 59.5 mg/dL   Total Protein 7.3 6.5 - 8.1 g/dL   Albumin 4.0 3.5 - 5.0 g/dL   AST 21 15 - 41 U/L   ALT 23 0 - 44 U/L   Alkaline Phosphatase 77 38 - 126 U/L   Total Bilirubin 0.6 0.3 - 1.2 mg/dL   GFR, Estimated >63 >87 mL/min    Comment: (NOTE) Calculated using the  CKD-EPI Creatinine Equation (2021)    Anion gap 10 5 - 15    Comment: Performed at [facility], [address], [place], [place] [address]  Lipase, blood     Status: Abnormal   Collection Time: 04/25/23 12:03 AM  Result Value Ref Range   Lipase 57 (H) 11 - 51 U/L    Comment: Performed at [facility], [address] [place], [place] [address]  Lactic acid, plasma     Status: Abnormal   Collection Time: 04/25/23 12:03 AM  Result Value Ref Range   Lactic Acid, Venous 2.8 (HH) 0.5 - 1.9 mmol/L    Comment: CRITICAL RESULT CALLED TO, READ BACK BY AND VERIFIED WITH: CALLED TO BETH CRABTREE RN 04/25/23 0049 BY Bartolo Darter BOWMAN Performed at [facility], [address] [place], [place] [address]   Lactic acid, plasma     Status: None   Collection Time: 04/25/23  1:58 AM  Result Value Ref Range   Lactic Acid, Venous 1.6 0.5 - 1.9 mmol/L    Comment: Performed at [facility], [address], [place], [place] [address]  Troponin I (High Sensitivity)     Status: None   Collection Time: 04/25/23  1:58 AM  Result Value Ref Range   Troponin I (High Sensitivity) 5 <18 ng/L    Comment: (NOTE) Elevated high sensitivity troponin I (hsTnI) values and significant  changes across serial measurements may suggest ACS but many other  chronic and acute conditions are known to  elevate hsTnI results.  Refer to the "Links" section for chest pain algorithms and additional  guidance. Performed at [facility], [address] [place], [place] [address]    CT Angio Chest/Abd/Pel for Dissection W and/or W/WO  Result Date: 04/25/2023 CLINICAL DATA:  Acute aortic syndrome suspected.  Emesis. EXAM: CT ANGIOGRAPHY CHEST, ABDOMEN AND PELVIS TECHNIQUE: Non-contrast CT of the chest was initially obtained. Multidetector CT imaging through the chest, abdomen and pelvis was performed using the standard protocol during bolus administration of intravenous contrast. Multiplanar reconstructed images and MIPs were obtained and reviewed to evaluate the vascular anatomy. RADIATION DOSE REDUCTION: This exam was performed according to the departmental dose-optimization program which includes automated exposure control, adjustment of the mA and/or kV according to patient size and/or use of iterative reconstruction technique. CONTRAST:  OMNIPAQUE IOHEXOL 350 MG/ML SOLN COMPARISON:  CT angiogram chest abdomen and pelvis 04/05/2023. FINDINGS: CTA CHEST FINDINGS Cardiovascular: The ascending aorta is mildly dilated measuring up 4 cm, unchanged. There is no evidence for aortic dissection. Heart size is within normal limits. There is no central pulmonary embolism or pericardial effusion. Mediastinum/Nodes: No enlarged mediastinal, hilar, or axillary lymph nodes. Thyroid gland, trachea, and esophagus demonstrate no significant findings. Lungs/Pleura: There is linear atelectasis in the left lung base. The lungs are otherwise clear. There is no pleural effusion or pneumothorax. Musculoskeletal: No chest wall abnormality. No acute or significant osseous findings. Review of the MIP images confirms the above findings. CTA ABDOMEN AND PELVIS FINDINGS VASCULAR Aorta: Normal caliber aorta without aneurysm, dissection, vasculitis or significant stenosis. There is mild calcified atherosclerotic  disease throughout the aorta. Celiac: Patent without evidence of aneurysm, dissection, vasculitis or significant stenosis. SMA: Patent without evidence of aneurysm, dissection, vasculitis or significant stenosis. Renals: Both renal arteries are patent without evidence of aneurysm, dissection, vasculitis, fibromuscular dysplasia or significant stenosis. IMA: Patent without evidence of aneurysm, dissection, vasculitis or significant stenosis. Inflow: Patent without evidence of aneurysm, dissection, vasculitis or  significant stenosis. Veins: No obvious venous abnormality within the limitations of this arterial phase study. Review of the MIP images confirms the above findings. NON-VASCULAR Hepatobiliary: There is gallbladder wall thickening with mild surrounding inflammatory stranding. No biliary ductal dilatation. The liver is within normal limits. Pancreas: Unremarkable. No pancreatic ductal dilatation or surrounding inflammatory changes. Spleen: Normal in size without focal abnormality. Adrenals/Urinary Tract: Bladder is distended. The kidneys and adrenal glands are within normal limits. Stomach/Bowel: Stomach is within normal limits. Appendix appears normal. No evidence of bowel wall thickening, distention, or inflammatory changes. There is sigmoid colon diverticulosis. Lymphatic: No enlarged lymph nodes are identified. Reproductive: Prostate gland is prominent in size. Other: No abdominal wall hernia or abnormality. No abdominopelvic ascites. Musculoskeletal: No acute findings. Review of the MIP images confirms the above findings. IMPRESSION: 1. No evidence for aortic dissection or aneurysm. 2. 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 Recommend annual imaging followup by CTA or MRA. This recommendation follows 2010 ACCF/AHA/AATS/ACR/ASA/SCA/SCAI/SIR/STS/SVM Guidelines for the Diagnosis and Management of Patients with Thoracic Aortic Disease. Circulation. 2010; 121: X914-N829. Aortic aneurysm NOS  (ICD10-I71.9) 3. Gallbladder wall thickening with mild surrounding inflammatory stranding worrisome for acute cholecystitis. 4. Distended bladder. 5. Sigmoid colon diverticulosis. Aortic Atherosclerosis (ICD10-I70.0). Electronically Signed   By: Darliss Cheney M.D.   On: 04/25/2023 03:25   DG Chest Port 1 View  Result Date: 04/25/2023 CLINICAL DATA:  Chest pain and emesis. EXAM: PORTABLE CHEST 1 VIEW COMPARISON:  04/05/2023. FINDINGS: The heart size and mediastinal contours are stable. The pulmonary vasculature is mildly distended. No consolidation, effusion, or pneumothorax. No acute osseous abnormality. IMPRESSION: Mildly distended pulmonary vasculature. Electronically Signed   By: Thornell Sartorius M.D.   On: 04/25/2023 00:15      Assessment/Plan 72 y/o M who presents with his third episode of RUQ and epigastric abdominal pain that now radiates to his back. The pain is associated with nausea and vomiting. The pain is increasing in severity. He has no evidence of cholelithiasis but I do think his history is consistent with gallbladder disease. RUQ U/S is pending. On CT he has some gallbladder wall thickening. There is no biliary ductal dilation. LFTs are WNL and WBC is 14.9. He has had a negative cardiac workup in the ED and was seen by a cardiologist 10/10 where they agreed he had non-cardiac chest/epigastric pain. His BP was normal during the visit.  Will discuss with MD. Given high suspicion for acalculous cholecystitis I think cholecystectomy, without HIDA scan, is reasonable.     I reviewed nursing notes, ED provider notes, hospitalist notes, last 24 h vitals and pain scores, last 48 h intake and output, last 24 h labs and trends, and last 24 h imaging results.  Adam Phenix, PA-C [facility] 04/25/2023, 3:26 PM Please see Amion for pager number during day hours 7:00am-4:30pm or 7:00am -11:30am on weekends

## 2023-04-25 NOTE — Plan of Care (Signed)

## 2023-04-25 NOTE — ED Notes (Signed)
Called Bed Placement; awaiting dc for bed assignment

## 2023-04-25 NOTE — ED Provider Notes (Signed)
Patient arrived at [facility] for admission by hospitalist and consultation by surgery. He reports pain is "5/5", no nausea.  Physical Exam  BP 126/80 (BP Location: Left Arm)   Pulse 90   Temp 98.2 F (36.8 C) (Oral)   Resp 17   SpO2 96%   Physical Exam Vitals reviewed.    Appears in NAD, VSS. Procedures   ED Course / MDM   Clinical Course as of 04/25/23 1212  Fri Apr 25, 2023  0023 I personally viewed the images from radiology studies and agree with radiologist interpretation: CXR is clear [CS]  0051 CMP with CKD about at baseline. Lipase is also at baseline. Lactic acid is mildly elevated, will recheck after IVF.  [CS]  0120 CBC with mild leukocytosis which is increased from previous visit. Trop remains normal.  [CS]  0227 Patient reports pain has improved but still markedly hypertensive. Will recheck CTA.  [CS]  0336 Lactic acid has normalized. Repeat trop remains normal. I personally viewed the images from radiology studies and agree with radiologist interpretation: CTA neg for acute vascular issue, but now evidence of cholecystitis. Will begin Abx, hydralazine for HTN, discuss with Gen Surg.  [CS]  [phone] Spoke with Dr. Janee Morn, Gen Surg, who requests patient be admitted to Hospitalist for BP management prior to OR.  [CS]  0403 Spoke with Dr. Julian Reil, Hospitalist, who will accept for admission.  [CS]    Clinical Course User Index [CS] Pollyann Savoy, MD   Medical Decision Making Amount and/or Complexity of Data Reviewed Labs: ordered. Radiology: ordered.  Risk Prescription drug management. Decision regarding hospitalization.   Will make hospitalist and surgical team aware of his arrival.        Elpidio Anis, Cordelia Poche 04/25/23 1214    Margarita Grizzle, MD 04/28/23 1227

## 2023-04-25 NOTE — H&P (Signed)
History and Physical   Jim Mcknight CWC:[id number] DOB: 1950/07/31 DOA: 04/24/2023  PCP: Sigmund Hazel, MD   Patient coming from: Home  Chief Complaint: Epigastric pain  HPI: Jim Mcknight is a 72 y.o. male with medical history significant of hypertension, hyperlipidemia presenting with epigastric pain  Patient presented to stand-alone ED yesterday evening after sudden onset of severe epigastric pain around 9 PM.  Pain radiated to back and had some associated nausea.  Patient had similar episode on 10/5 for which he was seen in the ED and had reassuring lab work.  Imaging done at that time showed CTA chest abdomen pelvis with subtle fat stranding at the upper gallbladder but negative right upper quadrant ultrasound.  Pain improved and patient was discharged.  Patient denies fevers, chills, chest pain, shortness of breath, constipation, diarrhea.  ED Course: Vital signs in the ED notable for blood pressure in the 120s to 230s systolic currently improved in the 120s to 150s.  Lab workup included CMP with sodium 134, potassium 3.3, creatinine 1.26 which is near baseline, glucose 136.  CBC showed leukocytosis of 14.9, platelets 451.  Troponin negative x 2.  Lipase mildly elevated at 57.  Lactic acid initially elevated to 2.8 but now improved to normal.  Chest x-ray showed mild pulmonary distention.  CT of the chest abdomen pelvis this time showed no evidence dissection, shows stable ascending 4 cm aneurysm and showed gallbladder wall thickening and inflammatory changes consistent with acute cholecystitis.  Patient received Dilaudid, Toradol, ceftriaxone, Flagyl in the ED.  Also received Zofran, 1 L and as needed hydralazine for blood pressure.  General surgery consulted and will see the patient and asked for medicine to admit due to patient's severely elevated blood pressure on presentation.  Review of Systems: As per HPI otherwise all other systems reviewed and are negative.  Past Medical  History:  Diagnosis Date   Arthritis    Cancer (HCC)    right shoulder Melanoma removed   Elevated PSA    Hypertension    Wears glasses     Past Surgical History:  Procedure Laterality Date   ANAL FISSURE REPAIR  1978   EXCISION METACARPAL MASS Left 01/07/2022   Procedure: LEFT THUMB MASS EXCISION;  Surgeon: Betha Loa, MD;  Location: [facility];  Service: Orthopedics;  Laterality: Left;   PROSTATE BIOPSY N/A 08/22/2016   Procedure: BIOPSY TRANSRECTAL ULTRASONIC PROSTATE (TUBP);  Surgeon: Jethro Bolus, MD;  Location: [facility];  Service: Urology;  Laterality: N/A;   PROSTATE BIOPSY N/A 01/27/2020   Procedure: BIOPSY TRANSRECTAL ULTRASONIC PROSTATE (TUBP);  Surgeon: Heloise Purpura, MD;  Location: [facility];  Service: Urology;  Laterality: N/A;   TENDON EXPLORATION Left 01/07/2022   Procedure: DEBRIDEMENT INTERPHALANGEAL JOINT;  Surgeon: Betha Loa, MD;  Location: [facility];  Service: Orthopedics;  Laterality: Left;   XI ROBOTIC ASSISTED INGUINAL HERNIA REPAIR WITH MESH Bilateral 10/17/2022   Procedure: XI ROBOTIC ASSISTED INGUINAL HERNIA REPAIR WITH MESH;  Surgeon: Kinsinger, De Blanch, MD;  Location: [facility];  Service: General;  Laterality: Bilateral;  MESH    Social History  reports that he has never smoked. He has never used smokeless tobacco. He reports that he does not drink alcohol and does not use drugs.  No Known Allergies  History reviewed. No pertinent family history.  Prior to Admission medications   Medication Sig Start Date End Date Taking? Authorizing Provider  aspirin 81 MG chewable tablet Chew 81 mg by mouth daily. 02/10/14  Yes [provider]  atorvastatin (LIPITOR) 10 MG tablet Take 10 mg by mouth daily. 10/19/19  Yes [provider]  Chlorpheniramine-Acetaminophen (CORICIDIN HBP COLD/FLU PO) Take 1 tablet by mouth 2 (two) times daily as needed (Cold/flu).   Yes [provider]   Cholecalciferol (VITAMIN D-3) 1000 units CAPS Take 1,000 Units by mouth daily.    Yes [provider]  Coenzyme Q10 (CO Q 10) 100 MG CAPS Take 100 mg by mouth daily.   Yes [provider]  ECHINACEA PO Take 1 capsule by mouth daily as needed.   Yes [provider]  folic acid (FOLVITE) 800 MCG tablet Take 800 mcg by mouth daily.   Yes [provider]  lisinopril-hydrochlorothiazide (PRINZIDE,ZESTORETIC) 20-12.5 MG tablet Take 1 tablet by mouth every morning.   Yes [provider]  metoprolol succinate (TOPROL-XL) 50 MG 24 hr tablet Take 50 mg by mouth daily. 12/08/19  Yes [provider]  vitamin C (ASCORBIC ACID) 500 MG tablet Take 500 mg by mouth daily.   Yes [provider]    Physical Exam: Vitals:   04/25/23 1230 04/25/23 1231 04/25/23 1232 04/25/23 1255  BP: 128/83   (!) 125/97  Pulse: 81   89  Resp: 19   19  Temp:  98.4 F (36.9 C)  98.4 F (36.9 C)  TempSrc:  Oral  Oral  SpO2: 100%   98%  Weight:   87.6 kg   Height:   6\' 1"  (1.854 m)     Physical Exam Constitutional:      General: He is not in acute distress.    Appearance: Normal appearance.  HENT:     Head: Normocephalic and atraumatic.     Mouth/Throat:     Mouth: Mucous membranes are moist.     Pharynx: Oropharynx is clear.  Eyes:     Extraocular Movements: Extraocular movements intact.     Pupils: Pupils are equal, round, and reactive to light.  Cardiovascular:     Rate and Rhythm: Normal rate and regular rhythm.     Pulses: Normal pulses.     Heart sounds: Normal heart sounds.  Pulmonary:     Effort: Pulmonary effort is normal. No respiratory distress.     Breath sounds: Normal breath sounds.  Abdominal:     General: Bowel sounds are normal. There is no distension.     Palpations: Abdomen is soft.     Tenderness: There is abdominal tenderness.  Musculoskeletal:        General: No swelling or deformity.  Skin:    General: Skin is warm and  dry.  Neurological:     General: No focal deficit present.     Mental Status: Mental status is at baseline.     Labs on Admission: I have personally reviewed following labs and imaging studies  CBC: Recent Labs  Lab 04/24/23 2347  WBC 14.9*  HGB 13.9  HCT 42.4  MCV 85.8  PLT 451*    Basic Metabolic Panel: Recent Labs  Lab 04/25/23 0003  NA 134*  K 3.3*  CL 99  CO2 25  GLUCOSE 136*  BUN 16  CREATININE 1.26*  CALCIUM 9.2    GFR: Estimated Creatinine Clearance: 59.9 mL/min (A) (by C-G formula based on SCr of 1.26 mg/dL (H)).  Liver Function Tests: Recent Labs  Lab 04/25/23 0003  AST 21  ALT 23  ALKPHOS 77  BILITOT 0.6  PROT 7.3  ALBUMIN 4.0    Urine analysis:  No results found for: "COLORURINE", "APPEARANCEUR", "LABSPEC", "PHURINE", "GLUCOSEU", "HGBUR", "BILIRUBINUR", "KETONESUR", "PROTEINUR", "UROBILINOGEN", "NITRITE", "LEUKOCYTESUR"  Radiological Exams on Admission: CT Angio Chest/Abd/Pel for Dissection W and/or W/WO  Result Date: 04/25/2023 CLINICAL DATA:  Acute aortic syndrome suspected.  Emesis. EXAM: CT ANGIOGRAPHY CHEST, ABDOMEN AND PELVIS TECHNIQUE: Non-contrast CT of the chest was initially obtained. Multidetector CT imaging through the chest, abdomen and pelvis was performed using the standard protocol during bolus administration of intravenous contrast. Multiplanar reconstructed images and MIPs were obtained and reviewed to evaluate the vascular anatomy. RADIATION DOSE REDUCTION: This exam was performed according to the departmental dose-optimization program which includes automated exposure control, adjustment of the mA and/or kV according to patient size and/or use of iterative reconstruction technique. CONTRAST:  OMNIPAQUE IOHEXOL 350 MG/ML SOLN COMPARISON:  CT angiogram chest abdomen and pelvis 04/05/2023. FINDINGS: CTA CHEST FINDINGS Cardiovascular: The ascending aorta is mildly dilated measuring up 4 cm, unchanged. There is no evidence for  aortic dissection. Heart size is within normal limits. There is no central pulmonary embolism or pericardial effusion. Mediastinum/Nodes: No enlarged mediastinal, hilar, or axillary lymph nodes. Thyroid gland, trachea, and esophagus demonstrate no significant findings. Lungs/Pleura: There is linear atelectasis in the left lung base. The lungs are otherwise clear. There is no pleural effusion or pneumothorax. Musculoskeletal: No chest wall abnormality. No acute or significant osseous findings. Review of the MIP images confirms the above findings. CTA ABDOMEN AND PELVIS FINDINGS VASCULAR Aorta: Normal caliber aorta without aneurysm, dissection, vasculitis or significant stenosis. There is mild calcified atherosclerotic disease throughout the aorta. Celiac: Patent without evidence of aneurysm, dissection, vasculitis or significant stenosis. SMA: Patent without evidence of aneurysm, dissection, vasculitis or significant stenosis. Renals: Both renal arteries are patent without evidence of aneurysm, dissection, vasculitis, fibromuscular dysplasia or significant stenosis. IMA: Patent without evidence of aneurysm, dissection, vasculitis or significant stenosis. Inflow: Patent without evidence of aneurysm, dissection, vasculitis or significant stenosis. Veins: No obvious venous abnormality within the limitations of this arterial phase study. Review of the MIP images confirms the above findings. NON-VASCULAR Hepatobiliary: There is gallbladder wall thickening with mild surrounding inflammatory stranding. No biliary ductal dilatation. The liver is within normal limits. Pancreas: Unremarkable. No pancreatic ductal dilatation or surrounding inflammatory changes. Spleen: Normal in size without focal abnormality. Adrenals/Urinary Tract: Bladder is distended. The kidneys and adrenal glands are within normal limits. Stomach/Bowel: Stomach is within normal limits. Appendix appears normal. No evidence of bowel wall thickening,  distention, or inflammatory changes. There is sigmoid colon diverticulosis. Lymphatic: No enlarged lymph nodes are identified. Reproductive: Prostate gland is prominent in size. Other: No abdominal wall hernia or abnormality. No abdominopelvic ascites. Musculoskeletal: No acute findings. Review of the MIP images confirms the above findings. IMPRESSION: 1. No evidence for aortic dissection or aneurysm. 2. 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 Recommend annual imaging followup by CTA or MRA. This recommendation follows 2010 ACCF/AHA/AATS/ACR/ASA/SCA/SCAI/SIR/STS/SVM Guidelines for the Diagnosis and Management of Patients with Thoracic Aortic Disease. Circulation. 2010; 121: U132-G401. Aortic aneurysm NOS (ICD10-I71.9) 3. Gallbladder wall thickening with mild surrounding inflammatory stranding worrisome for acute cholecystitis. 4. Distended bladder. 5. Sigmoid colon diverticulosis. Aortic Atherosclerosis (ICD10-I70.0). Electronically Signed   By: Darliss Cheney M.D.   On: 04/25/2023 03:25   DG Chest Port 1 View  Result Date: 04/25/2023 CLINICAL DATA:  Chest pain and emesis. EXAM: PORTABLE CHEST 1 VIEW COMPARISON:  04/05/2023. FINDINGS: The heart size and mediastinal contours are stable. The pulmonary vasculature is mildly distended. No consolidation, effusion,  or pneumothorax. No acute osseous abnormality. IMPRESSION: Mildly distended pulmonary vasculature. Electronically Signed   By: Thornell Sartorius M.D.   On: 04/25/2023 00:15    EKG: Independently reviewed.  Sinus bradycardia at 54 bpm.  Assessment/Plan Principal Problem:   Acute cholecystitis Active Problems:   Benign essential hypertension   Hyperlipidemia   Acute cholecystitis > Recurrent significant epigastric pain.  Mild inflammatory changes on presentation on 10/5 on CT with negative right upper quadrant ultrasound.  Now increased inflammatory changes and gallbladder wall thickening consistent with acute cholecystitis. > Also  noted to have initially mildly lactic acid which has improved with fluids, leukocytosis to 14.9. > General Surgery consulted in ED and notified of patient arrival - Monitor on med/surg, with pulse ox - Appreciate general surgery recommendations and assistance - IV fluids - N.p.o. for now - As needed Tylenol for mild pain, Norco for moderate to severe pain, Dilaudid for severe breakthrough pain - Supportive care  Hypertension - Resume home lisinopril-hydrochlorothiazide (when no longer NPO) - As needed hydralazine  Hyperlipidemia - Continue home atorvastatin  DVT prophylaxis: SCDs Code Status:   Full Family Communication:  Updated at bedside  Disposition Plan:   Patient is from:  Home  Anticipated DC to:  Home  Anticipated DC date:  1 to 3 days  Anticipated DC barriers: None  Consults called:  General Surgery Admission status:  Observation, Med/surg  Severity of Illness: The appropriate patient status for this patient is OBSERVATION. Observation status is judged to be reasonable and necessary in order to provide the required intensity of service to ensure the patient's safety. The patient's presenting symptoms, physical exam findings, and initial radiographic and laboratory data in the context of their medical condition is felt to place them at decreased risk for further clinical deterioration. Furthermore, it is anticipated that the patient will be medically stable for discharge from the hospital within 2 midnights of admission.    Synetta Fail MD [facility]  How to contact the [facility] Attending or Consulting provider 7A - 7P or covering provider during after hours 7P -7A, for this patient?   Check the care team in [facility] and look for a) attending/consulting [facility] provider listed and b) the [facility] team listed Log into [email or web address] and use [facility]'s universal password to access. If you do not have the password, please contact the hospital operator. Locate the [facility] provider  you are looking for under [facility] and page to a number that you can be directly reached. If you still have difficulty reaching the provider, please page the [facility] (Director on Call) for the Hospitalists listed on amion for assistance.  04/25/2023, 1:06 PM

## 2023-04-25 NOTE — ED Notes (Signed)
Called [facility] for Ed to Ed tx via [facility], accepting physician - Kristine Royal, MD. Hospitalist and GEN SURG team aware of the tx, per Alvira Monday, MD.

## 2023-04-25 NOTE — ED Provider Notes (Signed)
[place] EMERGENCY DEPARTMENT AT [facility]  Provider Note  CSN: [id number] Arrival date & time: 04/24/23 2318  History Chief Complaint  Patient presents with   Chest Pain    Jim Mcknight is a 72 y.o. male with history of HTN, HLD here for recurrence of severe epigastric pain, onset about 2100hrs tonight radiates into back and associated with nausea. He ws seen in the ED for similar on 10/5 at which time his labs were unremarkable, had CTA dissection and RUQ [place] which were neg. His BP was markedly elevated but improved without intervention and his pain also resolved prior to discharge. He was seen by Cardiology in follow up where they did not think this was cardiac related.    Home Medications Prior to Admission medications   Medication Sig Start Date End Date Taking? Authorizing Provider  aspirin 81 MG chewable tablet Chew 81 mg by mouth daily. 02/10/14   [provider]  atorvastatin (LIPITOR) 10 MG tablet Take 10 mg by mouth daily. 10/19/19   [provider]  Cholecalciferol (VITAMIN D-3) 1000 units CAPS Take 1,000 Units by mouth daily.     [provider]  Coenzyme Q10 (CO Q 10) 100 MG CAPS Take 100 mg by mouth daily.    [provider]  Coenzyme Q10-Red Yeast Rice 25-600 MG CAPS Take 1 capsule by mouth daily. 11/29/21   [provider]  folic acid (FOLVITE) 800 MCG tablet Take 800 mcg by mouth daily.    [provider]  lisinopril-hydrochlorothiazide (PRINZIDE,ZESTORETIC) 20-12.5 MG tablet Take 1 tablet by mouth every morning.    [provider]  metoprolol succinate (TOPROL-XL) 50 MG 24 hr tablet Take 50 mg by mouth daily. 12/08/19   [provider]  vitamin C (ASCORBIC ACID) 500 MG tablet Take 500 mg by mouth daily.    [provider]     Allergies    Patient has no known allergies.   Review of Systems   Review of Systems Please see HPI for pertinent positives and  negatives  Physical Exam BP (!) 189/100   Pulse 63   Temp 97.7 F (36.5 C) (Oral)   Resp 11   SpO2 98%   Physical Exam Vitals and nursing note reviewed.  Constitutional:      Appearance: Normal appearance.  HENT:     Head: Normocephalic and atraumatic.     Nose: Nose normal.     Mouth/Throat:     Mouth: Mucous membranes are moist.  Eyes:     Extraocular Movements: Extraocular movements intact.     Conjunctiva/sclera: Conjunctivae normal.  Cardiovascular:     Rate and Rhythm: Normal rate.  Pulmonary:     Effort: Pulmonary effort is normal.     Breath sounds: Normal breath sounds.  Abdominal:     General: Abdomen is flat.     Palpations: Abdomen is soft.     Tenderness: There is abdominal tenderness (epigastic).  Musculoskeletal:        General: No swelling. Normal range of motion.     Cervical back: Neck supple.  Skin:    General: Skin is warm and dry.  Neurological:     General: No focal deficit present.     Mental Status: He is alert.  Psychiatric:        Mood and Affect: Mood normal.     ED Results / Procedures / Treatments   EKG EKG Interpretation Date/Time:  Thursday April 24 2023 23:42:08 EDT Ventricular Rate:  54 PR Interval:  204 QRS Duration:  96 QT Interval:  434 QTC Calculation: 411 R Axis:   32  Text Interpretation: Sinus bradycardia Possible Left atrial enlargement Borderline ECG When compared with ECG of 05-Apr-2023 04:10, PREVIOUS ECG IS PRESENT No significant change since last tracing Confirmed by Susy Frizzle [phone]) on 04/24/2023 11:53:01 PM  Procedures Procedures  Medications Ordered in the ED Medications  cefTRIAXone (ROCEPHIN) 2 g in sodium chloride 0.9 % 100 mL IVPB (2 g Intravenous New Bag/Given 04/25/23 0344)  hydrALAZINE (APRESOLINE) injection 10 mg (has no administration in time range)  HYDROmorphone (DILAUDID) injection 1 mg (1 mg Intravenous Given 04/25/23 0024)  ondansetron (ZOFRAN) injection 4 mg (4 mg Intravenous  Given 04/25/23 0023)  sodium chloride 0.9 % bolus 1,000 mL (0 mLs Intravenous Stopped 04/25/23 0158)  iohexol (OMNIPAQUE) 350 MG/ML injection 100 mL (100 mLs Intravenous Contrast Given 04/25/23 0256)  hydrALAZINE (APRESOLINE) injection 10 mg (10 mg Intravenous Given 04/25/23 0340)    Initial Impression and Plan  Patient here for chest pain but really more epigastric pain, radiating into back. Had similar with a hypertensive episode a few weeks ago with an overall negative ED workup. Will check labs, pain/nausea meds for comfort and reassess for BP and/or symptom improvement.   ED Course   Clinical Course as of 04/25/23 0404  Caleen Essex Apr 25, 2023  0023 I personally viewed the images from radiology studies and agree with radiologist interpretation: CXR is clear [CS]  0051 CMP with CKD about at baseline. Lipase is also at baseline. Lactic acid is mildly elevated, will recheck after IVF.  [CS]  0120 CBC with mild leukocytosis which is increased from previous visit. Trop remains normal.  [CS]  0227 Patient reports pain has improved but still markedly hypertensive. Will recheck CTA.  [CS]  0336 Lactic acid has normalized. Repeat trop remains normal. I personally viewed the images from radiology studies and agree with radiologist interpretation: CTA neg for acute vascular issue, but now evidence of cholecystitis. Will begin Abx, hydralazine for HTN, discuss with Gen Surg.  [CS]  [phone] Spoke with Dr. Janee Morn, Gen Surg, who requests patient be admitted to Hospitalist for BP management prior to OR.  [CS]  0403 Spoke with Dr. Julian Reil, Hospitalist, who will accept for admission.  [CS]    Clinical Course User Index [CS] Pollyann Savoy, MD     MDM Rules/Calculators/A&P Medical Decision Making Problems Addressed: Accelerated hypertension: acute illness or injury Acute cholecystitis: acute illness or injury  Amount and/or Complexity of Data Reviewed Labs: ordered. Decision-making details documented  in ED Course. Radiology: ordered and independent interpretation performed. Decision-making details documented in ED Course. ECG/medicine tests: ordered and independent interpretation performed. Decision-making details documented in ED Course.  Risk Prescription drug management. Decision regarding hospitalization.     Final Clinical Impression(s) / ED Diagnoses Final diagnoses:  Acute cholecystitis  Accelerated hypertension    Rx / DC Orders ED Discharge Orders     None        Pollyann Savoy, MD 04/25/23 [phone]

## 2023-04-25 NOTE — ED Notes (Signed)
Pt amb to the bathroom  

## 2023-04-25 NOTE — ED Notes (Signed)
Patient transported to CT 

## 2023-04-25 NOTE — Anesthesia Preprocedure Evaluation (Signed)
Anesthesia Evaluation  Patient identified by MRN, date of birth, ID band Patient awake    Reviewed: Allergy & Precautions, NPO status , Patient's Chart, lab work & pertinent test results  Airway Mallampati: III  TM Distance: >3 FB Neck ROM: Full    Dental  (+) Dental Advisory Given, Teeth Intact   Pulmonary neg pulmonary ROS   Pulmonary exam normal breath sounds clear to auscultation       Cardiovascular hypertension, Pt. on medications and Pt. on home beta blockers Normal cardiovascular exam Rhythm:Regular Rate:Normal     Neuro/Psych negative neurological ROS  negative psych ROS   GI/Hepatic negative GI ROS, Neg liver ROS,,,  Endo/Other  Hyperlipidemia  Renal/GU negative Renal ROS     Musculoskeletal  (+) Arthritis , Osteoarthritis,  Hx/o melanoma   Abdominal   Peds  Hematology negative hematology ROS (+)   Anesthesia Other Findings   Reproductive/Obstetrics                             Anesthesia Physical Anesthesia Plan  ASA: 2  Anesthesia Plan: General   Post-op Pain Management: Tylenol PO (pre-op)*   Induction: Intravenous  PONV Risk Score and Plan: 4 or greater and Treatment may vary due to age or medical condition, Ondansetron and Dexamethasone  Airway Management Planned: Oral ETT  Additional Equipment: None  Intra-op Plan:   Post-operative Plan: Extubation in OR  Informed Consent: I have reviewed the patients History and Physical, chart, labs and discussed the procedure including the risks, benefits and alternatives for the proposed anesthesia with the patient or authorized representative who has indicated his/her understanding and acceptance.     Dental advisory given  Plan Discussed with: CRNA  Anesthesia Plan Comments:         Anesthesia Quick Evaluation

## 2023-04-26 ENCOUNTER — Encounter (HOSPITAL_COMMUNITY)
Admission: EM | Disposition: A | Discharge status: Home / Self Care | Payer: Self-pay | Source: Home / Self Care | Attending: Emergency Medicine

## 2023-04-26 ENCOUNTER — Observation Stay (HOSPITAL_COMMUNITY): Payer: No Typology Code available for payment source | Admitting: Anesthesiology

## 2023-04-26 ENCOUNTER — Observation Stay (HOSPITAL_BASED_OUTPATIENT_CLINIC_OR_DEPARTMENT_OTHER): Payer: Self-pay | Admitting: Anesthesiology

## 2023-04-26 ENCOUNTER — Encounter (HOSPITAL_COMMUNITY): Payer: Self-pay | Admitting: Internal Medicine

## 2023-04-26 ENCOUNTER — Other Ambulatory Visit: Payer: Self-pay

## 2023-04-26 DIAGNOSIS — K8012 Calculus of gallbladder with acute and chronic cholecystitis without obstruction: Secondary | ICD-10-CM | POA: Diagnosis not present

## 2023-04-26 DIAGNOSIS — K810 Acute cholecystitis: Secondary | ICD-10-CM | POA: Diagnosis not present

## 2023-04-26 DIAGNOSIS — K8000 Calculus of gallbladder with acute cholecystitis without obstruction: Secondary | ICD-10-CM | POA: Diagnosis not present

## 2023-04-26 DIAGNOSIS — K8066 Calculus of gallbladder and bile duct with acute and chronic cholecystitis without obstruction: Secondary | ICD-10-CM | POA: Diagnosis not present

## 2023-04-26 HISTORY — PX: CHOLECYSTECTOMY: SHX55

## 2023-04-26 LAB — BASIC METABOLIC PANEL
Anion gap: 9 (ref 5–15)
BUN: 18 mg/dL (ref 8–23)
CO2: 22 mmol/L (ref 22–32)
Calcium: 8.5 mg/dL — ABNORMAL LOW (ref 8.9–10.3)
Chloride: 102 mmol/L (ref 98–111)
Creatinine, Ser: 1.28 mg/dL — ABNORMAL HIGH (ref 0.61–1.24)
GFR, Estimated: 59 mL/min — ABNORMAL LOW (ref >60)
Glucose, Bld: 116 mg/dL — ABNORMAL HIGH (ref 70–99)
Potassium: 4 mmol/L (ref 3.5–5.1)
Sodium: 133 mmol/L — ABNORMAL LOW (ref 135–145)

## 2023-04-26 LAB — COMPREHENSIVE METABOLIC PANEL
ALT: 21 U/L (ref 0–44)
ALT: 32 U/L (ref 0–44)
AST: 23 U/L (ref 15–41)
AST: 37 U/L (ref 15–41)
Albumin: 3.1 g/dL — ABNORMAL LOW (ref 3.5–5.0)
Albumin: 3.5 g/dL (ref 3.5–5.0)
Alkaline Phosphatase: 74 U/L (ref 38–126)
Alkaline Phosphatase: 69 U/L (ref 38–126)
Anion gap: 11 (ref 5–15)
Anion gap: 9 (ref 5–15)
BUN: 17 mg/dL (ref 8–23)
BUN: 19 mg/dL (ref 8–23)
CO2: 27 mmol/L (ref 22–32)
CO2: 23 mmol/L (ref 22–32)
Calcium: 9 mg/dL (ref 8.9–10.3)
Calcium: 8.3 mg/dL — ABNORMAL LOW (ref 8.9–10.3)
Chloride: 99 mmol/L (ref 98–111)
Chloride: 102 mmol/L (ref 98–111)
Creatinine, Ser: 1.36 mg/dL — ABNORMAL HIGH (ref 0.61–1.24)
Creatinine, Ser: 1.31 mg/dL — ABNORMAL HIGH (ref 0.61–1.24)
GFR, Estimated: 55 mL/min — ABNORMAL LOW (ref >60)
GFR, Estimated: 58 mL/min — ABNORMAL LOW (ref >60)
Glucose, Bld: 105 mg/dL — ABNORMAL HIGH (ref 70–99)
Glucose, Bld: 101 mg/dL — ABNORMAL HIGH (ref 70–99)
Potassium: 4.3 mmol/L (ref 3.5–5.1)
Potassium: 3.9 mmol/L (ref 3.5–5.1)
Sodium: 137 mmol/L (ref 135–145)
Sodium: 134 mmol/L — ABNORMAL LOW (ref 135–145)
Total Bilirubin: 1.1 mg/dL (ref 0.3–1.2)
Total Bilirubin: 0.8 mg/dL (ref 0.3–1.2)
Total Protein: 6.3 g/dL — ABNORMAL LOW (ref 6.5–8.1)
Total Protein: 6.4 g/dL — ABNORMAL LOW (ref 6.5–8.1)

## 2023-04-26 LAB — CBC
HCT: 41.3 % (ref 39.0–52.0)
Hemoglobin: 13.7 g/dL (ref 13.0–17.0)
MCH: 28 pg (ref 26.0–34.0)
MCHC: 33.2 g/dL (ref 30.0–36.0)
MCV: 84.5 fL (ref 80.0–100.0)
Platelets: 373 10*3/uL (ref 150–400)
RBC: 4.89 MIL/uL (ref 4.22–5.81)
RDW: 15.1 % (ref 11.5–15.5)
WBC: 15.9 10*3/uL — ABNORMAL HIGH (ref 4.0–10.5)
nRBC: 0 % (ref 0.0–0.2)

## 2023-04-26 SURGERY — LAPAROSCOPIC CHOLECYSTECTOMY
Anesthesia: General

## 2023-04-26 MED ORDER — SPY AGENT GREEN - (INDOCYANINE FOR INJECTION): 1.2500 mg | Freq: Once | INTRAMUSCULAR | Status: DC

## 2023-04-26 MED ORDER — DEXAMETHASONE SODIUM PHOSPHATE 10 MG/ML IJ SOLN
INTRAMUSCULAR | Status: DC | PRN
  Administered 2023-04-26: 10 mg via INTRAVENOUS

## 2023-04-26 MED ORDER — CEFAZOLIN SODIUM-DEXTROSE 2-4 GM/100ML-% IV SOLN
INTRAVENOUS | Status: AC
  Filled 2023-04-26: qty 100

## 2023-04-26 MED ORDER — AMLODIPINE BESYLATE 5 MG PO TABS
5.0000 mg | ORAL_TABLET | Freq: Every day | ORAL | Status: DC
  Administered 2023-04-26: 5 mg via ORAL
  Filled 2023-04-26: qty 1

## 2023-04-26 MED ORDER — CHLORHEXIDINE GLUCONATE 0.12 % MT SOLN
OROMUCOSAL | Status: AC
  Administered 2023-04-26: 15 mL via OROMUCOSAL
  Filled 2023-04-26: qty 15

## 2023-04-26 MED ORDER — ROCURONIUM BROMIDE 10 MG/ML (PF) SYRINGE
PREFILLED_SYRINGE | INTRAVENOUS | Status: DC | PRN
  Administered 2023-04-26: 60 mg via INTRAVENOUS

## 2023-04-26 MED ORDER — PROPOFOL 10 MG/ML IV BOLUS
INTRAVENOUS | Status: AC
  Filled 2023-04-26: qty 20

## 2023-04-26 MED ORDER — METRONIDAZOLE 500 MG/100ML IV SOLN
500.0000 mg | Freq: Once | INTRAVENOUS | Status: AC
  Administered 2023-04-26: 500 mg via INTRAVENOUS
  Filled 2023-04-26: qty 100

## 2023-04-26 MED ORDER — FENTANYL CITRATE (PF) 100 MCG/2ML IJ SOLN: 25.0000 ug | INTRAMUSCULAR | Status: DC | PRN

## 2023-04-26 MED ORDER — CHLORHEXIDINE GLUCONATE 0.12 % MT SOLN
15.0000 mL | Freq: Once | OROMUCOSAL | Status: AC
Start: 2023-04-26 — End: 2023-04-26

## 2023-04-26 MED ORDER — BUPIVACAINE HCL (PF) 0.25 % IJ SOLN
INTRAMUSCULAR | Status: AC
  Filled 2023-04-26: qty 30

## 2023-04-26 MED ORDER — ORAL CARE MOUTH RINSE: 15.0000 mL | Freq: Once | OROMUCOSAL | Status: AC

## 2023-04-26 MED ORDER — IBUPROFEN 600 MG PO TABS: 600.0000 mg | ORAL_TABLET | Freq: Four times a day (QID) | ORAL | 1 refills | Status: AC

## 2023-04-26 MED ORDER — BUPIVACAINE LIPOSOME 1.3 % IJ SUSP
INTRAMUSCULAR | Status: AC
  Filled 2023-04-26: qty 20

## 2023-04-26 MED ORDER — PHENYLEPHRINE HCL-NACL 20-0.9 MG/250ML-% IV SOLN
INTRAVENOUS | Status: DC | PRN
  Administered 2023-04-26: 20 ug/min via INTRAVENOUS

## 2023-04-26 MED ORDER — LIDOCAINE 2% (20 MG/ML) 5 ML SYRINGE
INTRAMUSCULAR | Status: DC | PRN
  Administered 2023-04-26: 80 mg via INTRAVENOUS

## 2023-04-26 MED ORDER — BUPIVACAINE HCL 0.25 % IJ SOLN
INTRAMUSCULAR | Status: DC | PRN
  Administered 2023-04-26: 20 mL

## 2023-04-26 MED ORDER — DEXMEDETOMIDINE HCL IN NACL 80 MCG/20ML IV SOLN
INTRAVENOUS | Status: DC | PRN
  Administered 2023-04-26 (×3): 4 ug via INTRAVENOUS

## 2023-04-26 MED ORDER — SODIUM CHLORIDE 0.9% FLUSH
10.0000 mL | Freq: Once | INTRAVENOUS | Status: AC
  Administered 2023-04-26: 10 mL via INTRAVENOUS

## 2023-04-26 MED ORDER — ACETAMINOPHEN 500 MG PO TABS
1000.0000 mg | ORAL_TABLET | ORAL | Status: AC
  Administered 2023-04-26: 1000 mg via ORAL
  Filled 2023-04-26: qty 2

## 2023-04-26 MED ORDER — ACETAMINOPHEN 500 MG PO TABS: 1000.0000 mg | ORAL_TABLET | Freq: Four times a day (QID) | ORAL | 3 refills | Status: AC

## 2023-04-26 MED ORDER — DOCUSATE SODIUM 100 MG PO CAPS: 100.0000 mg | ORAL_CAPSULE | Freq: Two times a day (BID) | ORAL | 2 refills | Status: AC

## 2023-04-26 MED ORDER — SODIUM CHLORIDE 0.9 % IV SOLN: INTRAVENOUS | Status: DC

## 2023-04-26 MED ORDER — BUPIVACAINE LIPOSOME 1.3 % IJ SUSP
INTRAMUSCULAR | Status: DC | PRN
  Administered 2023-04-26: 20 mL

## 2023-04-26 MED ORDER — FENTANYL CITRATE (PF) 250 MCG/5ML IJ SOLN
INTRAMUSCULAR | Status: AC
  Filled 2023-04-26: qty 5

## 2023-04-26 MED ORDER — DROPERIDOL 2.5 MG/ML IJ SOLN: 0.6250 mg | Freq: Once | INTRAMUSCULAR | Status: DC | PRN

## 2023-04-26 MED ORDER — SODIUM CHLORIDE 0.9 % IR SOLN
Status: DC | PRN
  Administered 2023-04-26: 250 mL

## 2023-04-26 MED ORDER — ALBUMIN HUMAN 5 % IV SOLN: INTRAVENOUS | Status: DC | PRN

## 2023-04-26 MED ORDER — FENTANYL CITRATE (PF) 250 MCG/5ML IJ SOLN
INTRAMUSCULAR | Status: DC | PRN
  Administered 2023-04-26 (×3): 50 ug via INTRAVENOUS

## 2023-04-26 MED ORDER — ONDANSETRON HCL 4 MG/2ML IJ SOLN
INTRAMUSCULAR | Status: DC | PRN
  Administered 2023-04-26: 4 mg via INTRAVENOUS

## 2023-04-26 MED ORDER — PROPOFOL 10 MG/ML IV BOLUS
INTRAVENOUS | Status: DC | PRN
  Administered 2023-04-26: 120 mg via INTRAVENOUS

## 2023-04-26 MED ORDER — OXYCODONE HCL 5 MG PO TABS: 5.0000 mg | ORAL_TABLET | ORAL | 0 refills | Status: AC | PRN

## 2023-04-26 MED ORDER — METHOCARBAMOL 750 MG PO TABS: 750.0000 mg | ORAL_TABLET | Freq: Four times a day (QID) | ORAL | 1 refills | Status: AC

## 2023-04-26 MED ORDER — SUGAMMADEX SODIUM 200 MG/2ML IV SOLN
INTRAVENOUS | Status: DC | PRN
  Administered 2023-04-26: 200 mg via INTRAVENOUS

## 2023-04-26 SURGICAL SUPPLY — 36 items
ADH SKN CLS APL DERMABOND .7 (GAUZE/BANDAGES/DRESSINGS) ×1
APL PRP STRL LF DISP 70% ISPRP (MISCELLANEOUS) ×1
APPLIER CLIP 5 13 M/L LIGAMAX5 (MISCELLANEOUS) ×1
APR CLP MED LRG 5 ANG JAW (MISCELLANEOUS) ×1
BAG SPEC RTRVL 10 TROC 200 (ENDOMECHANICALS) ×1
BLADE CLIPPER SURG (BLADE) IMPLANT
CANISTER SUCT 3000ML PPV (MISCELLANEOUS) ×1 IMPLANT
CHLORAPREP W/TINT 26 (MISCELLANEOUS) ×1 IMPLANT
CLIP APPLIE 5 13 M/L LIGAMAX5 (MISCELLANEOUS) ×1 IMPLANT
COVER SURGICAL LIGHT HANDLE (MISCELLANEOUS) ×1 IMPLANT
DERMABOND ADVANCED .7 DNX12 (GAUZE/BANDAGES/DRESSINGS) ×1 IMPLANT
ELECT CAUTERY BLADE 6.4 (BLADE) ×1 IMPLANT
ELECT REM PT RETURN 9FT ADLT (ELECTROSURGICAL) ×1
ELECTRODE REM PT RTRN 9FT ADLT (ELECTROSURGICAL) ×1 IMPLANT
GLOVE BIO SURGEON STRL SZ 6.5 (GLOVE) ×1 IMPLANT
GLOVE BIOGEL PI IND STRL 6 (GLOVE) ×1 IMPLANT
GOWN STRL REUS W/ TWL LRG LVL3 (GOWN DISPOSABLE) ×3 IMPLANT
GOWN STRL REUS W/TWL LRG LVL3 (GOWN DISPOSABLE) ×3
IRRIG SUCT STRYKERFLOW 2 WTIP (MISCELLANEOUS) ×1
IRRIGATION SUCT STRKRFLW 2 WTP (MISCELLANEOUS) IMPLANT
KIT BASIN OR (CUSTOM PROCEDURE TRAY) ×1 IMPLANT
KIT TURNOVER KIT B (KITS) ×1 IMPLANT
NS IRRIG 1000ML POUR BTL (IV SOLUTION) ×1 IMPLANT
PAD ARMBOARD 7.5X6 YLW CONV (MISCELLANEOUS) ×1 IMPLANT
PENCIL BUTTON HOLSTER BLD 10FT (ELECTRODE) ×1 IMPLANT
POUCH RETRIEVAL ECOSAC 10 (ENDOMECHANICALS) ×1 IMPLANT
SCISSORS LAP 5X35 DISP (ENDOMECHANICALS) ×1 IMPLANT
SET TUBE SMOKE EVAC HIGH FLOW (TUBING) ×1 IMPLANT
SLEEVE Z-THREAD 5X100MM (TROCAR) ×2 IMPLANT
SUT MNCRL AB 4-0 PS2 18 (SUTURE) ×1 IMPLANT
TOWEL GREEN STERILE FF (TOWEL DISPOSABLE) ×1 IMPLANT
TRAY LAPAROSCOPIC MC (CUSTOM PROCEDURE TRAY) ×1 IMPLANT
TROCAR BALLN 12MMX100 BLUNT (TROCAR) ×1 IMPLANT
TROCAR Z-THREAD OPTICAL 5X100M (TROCAR) ×1 IMPLANT
WARMER LAPAROSCOPE (MISCELLANEOUS) ×1 IMPLANT
WATER STERILE IRR 1000ML POUR (IV SOLUTION) ×1 IMPLANT

## 2023-04-26 NOTE — Discharge Summary (Signed)
Physician Discharge Summary  Jim Mcknight NWG:[id number] DOB: 1950/08/18 DOA: 04/24/2023  PCP: Sigmund Hazel, MD  Admit date: 04/24/2023 Discharge date: 04/26/2023  Time spent: 20 minutes  Recommendations for Outpatient Follow-up:  Needs routine outpatient follow-up with general surgery Needs labs in about a week to check for azotemia has mildly dehydrated perioperatively  Discharge Diagnoses:  MAIN problem for hospitalization   Acute cholecystitis  Please see below for itemized issues addressed in HOpsital- refer to other progress notes for clarity if needed  Discharge Condition: Improved  Diet recommendation: Soft or as per surgery  Filed Weights   04/25/23 1232 04/26/23 0813  Weight: 87.6 kg 87.6 kg    History of present illness:   72 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  Biliary cause-RUQ [place] = no acute cholecystitis suspect subcentimeter polyp 10/24 developed evening ABD pain has had several attacks of this in the past 6 months to a year-thought nothing of it Pain progressed without chest pain radiation localized in the abdomen Return to ED as per hpi Stable ascending 4 cm aneurysm found on CT chest abdomen pelvis this time-Rx Dilaudid Toradol ceftriaxone Flagyl General Surgery consulted Blood pressure was quite elevated on admission (possibly 2/2 pain) Reinitiated Zestoretic 1, metoprolol XL 50  Patient was seen by general surgeon underwent lap chole for acalculus cholecystitis--- patient recovered rapidly had surgery in the morning and by afternoon when I reviewed him at around 4 PM he was able to actually tolerated a soft lunch of eggs only required 1  dose of IV pain meds and felt stable to go home I have encouraged him to follow up with general surgery for wound check as per their instructions and follow the weight limitations and not be too active immediately postop He will need labs in a week because he was little dehydrated but otherwise he is stable for discharge      Discharge Exam: Vitals:   04/26/23 1030 04/26/23 1047  BP: (!) 143/89 (!) 161/82  Pulse: 68 66  Resp: 18 18  Temp:  (!) 97.3 F (36.3 C)  SpO2: 93% 96%    Subj on day of d/c   Awake coherent pleasant  General Exam on discharge  Sitting up eating Menance focal deficit Chest clear Abdomen soft slightly tender no rebound No lower extremity edema Neuro intact  Discharge Instructions   Discharge Instructions     Discharge patient   Complete by: As directed    Discharge disposition: 01-Home or Self Care   Discharge patient date: 04/26/2023      Allergies as of 04/26/2023   No Known Allergies      Medication List     TAKE these medications    acetaminophen 500 MG tablet Commonly known as: TYLENOL Take 2 tablets (1,000 mg total) by mouth 4 (four) times daily.   ascorbic acid 500 MG tablet Commonly known as: VITAMIN C Take 500 mg by mouth daily.   aspirin 81 MG chewable tablet Chew 81 mg by mouth daily.   atorvastatin 10 MG tablet Commonly known as: LIPITOR Take 10 mg by mouth daily.   Co Q 10 100 MG Caps Take 100 mg by mouth daily.   CORICIDIN HBP COLD/FLU PO Take 1 tablet  by mouth 2 (two) times daily as needed (Cold/flu).   docusate sodium 100 MG capsule Commonly known as: Colace Take 1 capsule (100 mg total) by mouth 2 (two) times daily.   ECHINACEA PO Take 1 capsule by mouth daily as needed.   folic acid 800 MCG tablet Commonly known as: FOLVITE Take 800 mcg by mouth daily.   ibuprofen 600 MG tablet Commonly known as: ADVIL Take 1 tablet (600 mg total) by mouth 4 (four) times daily.    lisinopril-hydrochlorothiazide 20-12.5 MG tablet Commonly known as: ZESTORETIC Take 1 tablet by mouth every morning.   methocarbamol 750 MG tablet Commonly known as: Robaxin-750 Take 1 tablet (750 mg total) by mouth 4 (four) times daily.   metoprolol succinate 50 MG 24 hr tablet Commonly known as: TOPROL-XL Take 50 mg by mouth daily.   oxyCODONE 5 MG immediate release tablet Commonly known as: Roxicodone Take 1 tablet (5 mg total) by mouth every 4 (four) hours as needed.   Vitamin D-3 25 MCG (1000 UT) Caps Take 1,000 Units by mouth daily.       No Known Allergies  Follow-up Information     Diamantina Monks, MD Follow up in 2 week(s).   Specialty: Surgery Why: For wound re-check Contact information: [address] [facility] [place] [address] [phone]                  The results of significant diagnostics from this hospitalization (including imaging, microbiology, ancillary and laboratory) are listed below for reference.    Significant Diagnostic Studies: US ABDOMEN LIMITED RUQ (LIVER/GB)  Result Date: 04/25/2023 CLINICAL DATA:  Right upper quadrant pain EXAM: ULTRASOUND ABDOMEN LIMITED RIGHT UPPER QUADRANT COMPARISON:  Ultrasound 04/05/2023. CT angiogram earlier 04/25/2023. FINDINGS: Gallbladder: Dilated gallbladder with significant wall thickening measuring up to 6 mm. Sludge identified. No shadowing stones. Common bile duct: Diameter: 3 mm Liver: No focal lesion identified. Within normal limits in parenchymal echogenicity. Portal vein is patent on color Doppler imaging with normal direction of blood flow towards the liver. Other: None. IMPRESSION: Dilated gallbladder with sludge and significant wall thickening but no stones. No ductal dilatation. If there is further concern of acute cholecystitis, HIDA scan may be of some benefit as clinically directed Electronically Signed   By: Karen Kays M.D.   On: 04/25/2023 16:43    CT Angio Chest/Abd/Pel for Dissection W and/or W/WO  Result Date: 04/25/2023 CLINICAL DATA:  Acute aortic syndrome suspected.  Emesis. EXAM: CT ANGIOGRAPHY CHEST, ABDOMEN AND PELVIS TECHNIQUE: Non-contrast CT of the chest was initially obtained. Multidetector CT imaging through the chest, abdomen and pelvis was performed using the standard protocol during bolus administration of intravenous contrast. Multiplanar reconstructed images and MIPs were obtained and reviewed to evaluate the vascular anatomy. RADIATION DOSE REDUCTION: This exam was performed according to the departmental dose-optimization program which includes automated exposure control, adjustment of the mA and/or kV according to patient size and/or use of iterative reconstruction technique. CONTRAST:  OMNIPAQUE IOHEXOL 350 MG/ML SOLN COMPARISON:  CT angiogram chest abdomen and pelvis 04/05/2023. FINDINGS: CTA CHEST FINDINGS Cardiovascular: The ascending aorta is mildly dilated measuring up 4 cm, unchanged. There is no evidence for aortic dissection. Heart size is within normal limits. There is no central pulmonary embolism or pericardial effusion. Mediastinum/Nodes: No enlarged mediastinal, hilar, or axillary lymph nodes. Thyroid gland, trachea, and esophagus demonstrate no significant findings. Lungs/Pleura: There is linear atelectasis in the left lung base. The lungs are  otherwise clear. There is no pleural effusion or pneumothorax. Musculoskeletal: No chest wall abnormality. No acute or significant osseous findings. Review of the MIP images confirms the above findings. CTA ABDOMEN AND PELVIS FINDINGS VASCULAR Aorta: Normal caliber aorta without aneurysm, dissection, vasculitis or significant stenosis. There is mild calcified atherosclerotic disease throughout the aorta. Celiac: Patent without evidence of aneurysm, dissection, vasculitis or significant stenosis. SMA: Patent without evidence of aneurysm, dissection, vasculitis or  significant stenosis. Renals: Both renal arteries are patent without evidence of aneurysm, dissection, vasculitis, fibromuscular dysplasia or significant stenosis. IMA: Patent without evidence of aneurysm, dissection, vasculitis or significant stenosis. Inflow: Patent without evidence of aneurysm, dissection, vasculitis or significant stenosis. Veins: No obvious venous abnormality within the limitations of this arterial phase study. Review of the MIP images confirms the above findings. NON-VASCULAR Hepatobiliary: There is gallbladder wall thickening with mild surrounding inflammatory stranding. No biliary ductal dilatation. The liver is within normal limits. Pancreas: Unremarkable. No pancreatic ductal dilatation or surrounding inflammatory changes. Spleen: Normal in size without focal abnormality. Adrenals/Urinary Tract: Bladder is distended. The kidneys and adrenal glands are within normal limits. Stomach/Bowel: Stomach is within normal limits. Appendix appears normal. No evidence of bowel wall thickening, distention, or inflammatory changes. There is sigmoid colon diverticulosis. Lymphatic: No enlarged lymph nodes are identified. Reproductive: Prostate gland is prominent in size. Other: No abdominal wall hernia or abnormality. No abdominopelvic ascites. Musculoskeletal: No acute findings. Review of the MIP images confirms the above findings. IMPRESSION: 1. No evidence for aortic dissection or aneurysm. 2. 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 Recommend annual imaging followup by CTA or MRA. This recommendation follows 2010 ACCF/AHA/AATS/ACR/ASA/SCA/SCAI/SIR/STS/SVM Guidelines for the Diagnosis and Management of Patients with Thoracic Aortic Disease. Circulation. 2010; 121: Y865-H846. Aortic aneurysm NOS (ICD10-I71.9) 3. Gallbladder wall thickening with mild surrounding inflammatory stranding worrisome for acute cholecystitis. 4. Distended bladder. 5. Sigmoid colon diverticulosis. Aortic  Atherosclerosis (ICD10-I70.0). Electronically Signed   By: Darliss Cheney M.D.   On: 04/25/2023 03:25   DG Chest Port 1 View  Result Date: 04/25/2023 CLINICAL DATA:  Chest pain and emesis. EXAM: PORTABLE CHEST 1 VIEW COMPARISON:  04/05/2023. FINDINGS: The heart size and mediastinal contours are stable. The pulmonary vasculature is mildly distended. No consolidation, effusion, or pneumothorax. No acute osseous abnormality. IMPRESSION: Mildly distended pulmonary vasculature. Electronically Signed   By: Thornell Sartorius M.D.   On: 04/25/2023 00:15   US Abdomen Limited RUQ (LIVER/GB)  Result Date: 04/05/2023 CLINICAL DATA:  Right upper quadrant abdominal pain EXAM: ULTRASOUND ABDOMEN LIMITED RIGHT UPPER QUADRANT COMPARISON:  Repeat abdominal CT from earlier today FINDINGS: Gallbladder: Echogenic polypoid area on the right in upper gallbladder wall measuring 6 mm. No focal tenderness or detected to some Common bile duct: Diameter: 3 mm. Difficult visualization but no biliary dilatation by CT Liver: No focal lesion identified. Within normal limits in parenchymal echogenicity. Portal vein is patent on color Doppler imaging with normal direction of blood flow towards the liver. IMPRESSION: 1. No evidence of acute cholecystitis and no detected gallstone. 2. Suspect subcentimeter gallbladder polyp. Electronically Signed   By: Tiburcio Pea M.D.   On: 04/05/2023 09:11   CT Angio Chest/Abd/Pel for Dissection W and/or Wo Contrast  Result Date: 04/05/2023 CLINICAL DATA:  Chest pain a wakening the patient from sleep, worsening. A EXAM: CT ANGIOGRAPHY CHEST, ABDOMEN AND PELVIS TECHNIQUE: Non-contrast CT of the chest was initially obtained. Multidetector CT imaging through the chest, abdomen and pelvis was performed using the standard protocol during  bolus administration of intravenous contrast. Multiplanar reconstructed images and MIPs were obtained and reviewed to evaluate the vascular anatomy. RADIATION DOSE REDUCTION:  This exam was performed according to the departmental dose-optimization program which includes automated exposure control, adjustment of the mA and/or kV according to patient size and/or use of iterative reconstruction technique. CONTRAST:  OMNIPAQUE IOHEXOL 350 MG/ML SOLN COMPARISON:  None Available. FINDINGS: CTA CHEST FINDINGS Cardiovascular: Preferential opacification of the thoracic aorta. No evidence of thoracic aortic aneurysm or dissection. Normal heart size. No pericardial effusion. Mediastinum/Nodes: Negative for mass or adenopathy Lungs/Pleura: There is no edema, consolidation, effusion, or pneumothorax. Mild dependent atelectasis. Musculoskeletal: Thoracic spondylosis.  No explanation for symptoms. Review of the MIP images confirms the above findings. CTA ABDOMEN AND PELVIS FINDINGS VASCULAR Aorta: Atheromatous calcification.  No dissection or aneurysm Celiac: Unremarkable SMA: Unremarkable Renals: Smoothly contoured and widely patent IMA: Normal Inflow: Atheromatous plaque that is mild. No stenosis, aneurysm, or dissection Veins: Unremarkable Review of the MIP images confirms the above findings. NON-VASCULAR Hepatobiliary: No focal liver abnormality.Mild fat stranding around the neck of the gallbladder. No calcified stone or biliary dilatation. Pancreas: Unremarkable. Spleen: Unremarkable. Adrenals/Urinary Tract: Negative adrenals. No hydronephrosis or stone. Unremarkable bladder. Stomach/Bowel: No obstruction. No appendicitis. Distal colonic diverticulosis. Lymphatic: No mass or adenopathy. Reproductive:No pathologic findings. Other: No ascites or pneumoperitoneum. Musculoskeletal: No acute abnormalities.  Ordinary spondylosis. Review of the MIP images confirms the above findings. IMPRESSION: 1. Negative for acute aortic syndrome.  Mild atherosclerosis. 2. Subtle fat stranding around the upper gallbladder, consider biliary cause of symptoms. Electronically Signed   By: Tiburcio Pea M.D.   On:  04/05/2023 06:23   DG Chest 2 View  Result Date: 04/05/2023 CLINICAL DATA:  72 year old male with history of chest pain that woke him from sleeping. EXAM: CHEST - 2 VIEW COMPARISON:  No priors. FINDINGS: Lung volumes are low. No consolidative airspace disease. Trace left pleural effusion. No right pleural effusion. No pneumothorax. No pulmonary nodule or mass noted. Pulmonary vasculature and the cardiomediastinal silhouette are within normal limits. IMPRESSION: 1. Trace left pleural effusion. Electronically Signed   By: Trudie Reed M.D.   On: 04/05/2023 05:05    Microbiology: No results found for this or any previous visit (from the past 240 hour(s)).   Labs: Basic Metabolic Panel: Recent Labs  Lab 04/25/23 0003 04/26/23 0430 04/26/23 1101 04/26/23 1214  NA 134* 137 134* 133*  K 3.3* 4.3 3.9 4.0  CL 99 99 102 102  CO2 25 27 23 22   GLUCOSE 136* 105* 101* 116*  BUN 16 17 19 18   CREATININE 1.26* 1.36* 1.31* 1.28*  CALCIUM 9.2 9.0 8.3* 8.5*   Liver Function Tests: Recent Labs  Lab 04/25/23 0003 04/26/23 0430 04/26/23 1101  AST 21 23 37  ALT 23 21 32  ALKPHOS 77 74 69  BILITOT 0.6 1.1 0.8  PROT 7.3 6.3* 6.4*  ALBUMIN 4.0 3.1* 3.5   Recent Labs  Lab 04/25/23 0003  LIPASE 57*   No results for input(s): "AMMONIA" in the last 168 hours. CBC: Recent Labs  Lab 04/24/23 2347 04/26/23 0430  WBC 14.9* 15.9*  HGB 13.9 13.7  HCT 42.4 41.3  MCV 85.8 84.5  PLT 451* 373   Cardiac Enzymes: No results for input(s): "CKTOTAL", "CKMB", "CKMBINDEX", "TROPONINI" in the last 168 hours. BNP: BNP (last 3 results) No results for input(s): "BNP" in the last 8760 hours.  ProBNP (last 3 results) No results for input(s): "PROBNP" in the last 8760 hours.  CBG: No results for input(s): "GLUCAP" in the last 168 hours.     Signed:  Rhetta Mura MD   [facility] 04/26/2023, 4:07 PM

## 2023-04-26 NOTE — Progress Notes (Signed)
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  Biliary cause-RUQ [place] = no acute cholecystitis suspect subcentimeter polyp 10/24 developed evening ABD pain has had several attacks of this in the past 6 months to a year-thought nothing of it Pain progressed without chest pain radiation localized in the abdomen Return to ED as per hpi Stable ascending 4 cm aneurysm found on CT chest abdomen pelvis this time-Rx Dilaudid Toradol ceftriaxone Flagyl General Surgery consulted Blood pressure was quite elevated on admission (possibly 2/2 pain) Reinitiated Zestoretic 1, metoprolol XL 50  S Looks well feels well no chest pain no fever, ABD pain is about 7/10 about to be medicated Toradol Dilaudid hydrocodone on board  OBJECTIVE: BP 129/68 (BP Location: Left Arm)   Pulse 66   Temp 98.2 F (36.8 C) (Oral)   Resp 18   Ht 6\' 1"  (1.854 m)   Wt 87.6 kg   SpO2 100%   BMI 25.48 kg/m  Alert pleasant coherent interactive CTAB no added sound wheeze rales rhonchi RUQ tenderness noted no rebound No lower extremity edema  Happy to follow patient if needed however looks quite well-I do think that his blood pressure was driven by pain and once he has a lap chole later today as per my discussion with Dr.Lovick,    Mild AKI he is a little bit volume depleted, so we will give saline 75 cc/H his LFTs and not really elevated and his lactic acid is now down Hypokalemia his hypokalemia is improved from admission I would suggest discontinuation of HCTZ and addition of amlodipine which I have made as a dosage adjustment to his meds Acute cholecystitis-Per surgery  I will follow in the morning but if electrolytes  are stable and pain is relatively well-controlled, he could potentially discharge home if pain is controlled as per general surgery  Pleas Koch, MD [facility] 7:55 AM

## 2023-04-26 NOTE — Anesthesia Postprocedure Evaluation (Signed)
Anesthesia Post Note  Patient: Jim Mcknight  Procedure(s) Performed: LAPAROSCOPIC CHOLECYSTECTOMY     Patient location during evaluation: PACU Anesthesia Type: General Level of consciousness: sedated and patient cooperative Pain management: pain level controlled Vital Signs Assessment: post-procedure vital signs reviewed and stable Respiratory status: spontaneous breathing Cardiovascular status: stable Anesthetic complications: no   No notable events documented.  Last Vitals:  Vitals:   04/26/23 1030 04/26/23 1047  BP: (!) 143/89 (!) 161/82  Pulse: 68 66  Resp: 18 18  Temp:  (!) 36.3 C  SpO2: 93% 96%    Last Pain:  Vitals:   04/26/23 1047  TempSrc: Oral  PainSc:                  Lewie Loron

## 2023-04-26 NOTE — Op Note (Signed)
   Operative Note  Date: 04/26/2023  Procedure: laparoscopic cholecystectomy  Pre-op diagnosis: acute cholecystitis Post-op diagnosis: Acute acalculous cholecystitis  Indication and clinical history: The patient is a 72 y.o. year old male with acute acalculous cholecystitis  Surgeon: Diamantina Monks, MD  Anesthesiologist: Renold Don, MD Anesthesia: General  Findings:  Specimen: gallbladder EBL: <5cc Drains/Implants: none  Disposition: PACU - hemodynamically stable.  Description of procedure: The patient was positioned supine on the operating room table. Time-out was performed verifying correct patient, procedure, signature of informed consent, and administration of pre-operative antibiotics. General anesthetic induction and intubation were uneventful. The abdomen was prepped and draped in the usual sterile fashion. An infra-umbilical incision was made using an open technique using zero vicryl stay sutures on either side of the fascia and a 10mm Hassan port inserted. After establishing pneumoperitoneum, which the patient tolerated well, the abdominal cavity was inspected and no injury of any intra-abdominal structures was identified. Additional ports were placed under direct visualization and using local anesthetic: two 5mm ports in the right subcostal region and a 5mm port in the epigastric region. The patient was re-positioned to reverse Trendelenburg and right side up. Adhesiolysis was performed to expose the gallbladder, which was then retracted cephalad. The infundibulum was identified and retracted toward the right lower quadrant. The peritoneum was incised over the infundibulum and the triangle of Calot dissected to expose the critical view of safety. With clear identification and isolation of the cystic duct and cystic artery, the cystic artery was doubly clipped and divided. After this, the cystic duct was identified as a single structure entering the gallbladder, and was also doubly  clipped and divided. The gallbladder was dissected off the liver bed using electrocautery and hemostasis of the liver bed was confirmed prior to separation of the final peritoneal attachments of the gallbladder to the liver bed. The gallbladder fossa was irrigated and fluid returned clear. After transection of the final peritoneal attachments, the gallbladder was placed in an endoscopic specimen retrieval bag, removed via the umbilical port site, and sent to pathology as a permanent specimen. The gallbladder fossa was inspected confirming hemostasis, the absence of bile leakage from the cystic duct stump, and correct placement of clips on the cystic artery and cystic duct stumps. The abdomen was desufflated and the fascia of the umbilical port site was closed using the previously placed stay sutures. Additional local anesthetic was administered at the umbilical port site.  The skin of all incisions was closed with 4-0 monocryl. Sterile dressings were applied. All sponge and instrument counts were correct at the conclusion of the procedure. The patient was awakened from anesthesia, extubated uneventfully, and transported to the PACU - hemodynamically stable.. There were no complications.   Upon entering the abdomen (organ space), I encountered infection of the gallbladder .  CASE DATA:  Type of patient?: DOW CASE (Surgical Hospitalist [facility])  Status of Case? URGENT Add On  Infection Present At Time Of Surgery (PATOS)?  INFECTION of the gallbladder    Diamantina Monks, MD General and Trauma Surgery [facility]

## 2023-04-26 NOTE — Transfer of Care (Signed)
Immediate Anesthesia Transfer of Care Note  Patient: Jim Mcknight  Procedure(s) Performed: LAPAROSCOPIC CHOLECYSTECTOMY  Patient Location: PACU  Anesthesia Type:General  Level of Consciousness: drowsy, patient cooperative, and responds to stimulation  Airway & Oxygen Therapy: Patient Spontanous Breathing  Post-op Assessment: Report given to RN and Post -op Vital signs reviewed and stable  Post vital signs: Reviewed and stable  Last Vitals:  Vitals Value Taken Time  BP 142/78 (94)   Temp    Pulse 68 04/26/23 0959  Resp 13 04/26/23 0959  SpO2 97 % 04/26/23 0959  Vitals shown include unfiled device data.  Last Pain:  Vitals:   04/26/23 0606  TempSrc: Oral  PainSc:          Complications: No notable events documented.

## 2023-04-26 NOTE — Progress Notes (Signed)
   General Surgery Follow Up Note  Subjective:    Overnight Issues:   Objective:  Vital signs for last 24 hours: Temp:  [98.2 F (36.8 C)-98.9 F (37.2 C)] 98.2 F (36.8 C) (10/26 0606) Pulse Rate:  [66-90] 66 (10/26 0606) Resp:  [14-19] 18 (10/26 0606) BP: (112-154)/(60-97) 129/68 (10/26 0606) SpO2:  [96 %-100 %] 100 % (10/26 0606) Weight:  [87.6 kg] 87.6 kg (10/25 1232)  Hemodynamic parameters for last 24 hours:    Intake/Output from previous day: No intake/output data recorded.  Intake/Output this shift: No intake/output data recorded.  Vent settings for last 24 hours:    Physical Exam:  Gen: comfortable, no distress Neuro: follows commands, alert, communicative HEENT: PERRL Neck: supple CV: RRR Pulm: unlabored breathing on RA Abd: soft, NT    GU: urine clear and yellow, +spontaneous void Extr: wwp, no edema  Results for orders placed or performed during the hospital encounter of 04/24/23 (from the past 24 hour(s))  Comprehensive metabolic panel     Status: Abnormal   Collection Time: 04/26/23  4:30 AM  Result Value Ref Range   Sodium 137 135 - 145 mmol/L   Potassium 4.3 3.5 - 5.1 mmol/L   Chloride 99 98 - 111 mmol/L   CO2 27 22 - 32 mmol/L   Glucose, Bld 105 (H) 70 - 99 mg/dL   BUN 17 8 - 23 mg/dL   Creatinine, Ser 1.61 (H) 0.61 - 1.24 mg/dL   Calcium 9.0 8.9 - 09.6 mg/dL   Total Protein 6.3 (L) 6.5 - 8.1 g/dL   Albumin 3.1 (L) 3.5 - 5.0 g/dL   AST 23 15 - 41 U/L   ALT 21 0 - 44 U/L   Alkaline Phosphatase 74 38 - 126 U/L   Total Bilirubin 1.1 0.3 - 1.2 mg/dL   GFR, Estimated 55 (L) >60 mL/min   Anion gap 11 5 - 15  CBC     Status: Abnormal   Collection Time: 04/26/23  4:30 AM  Result Value Ref Range   WBC 15.9 (H) 4.0 - 10.5 K/uL   RBC 4.89 4.22 - 5.81 MIL/uL   Hemoglobin 13.7 13.0 - 17.0 g/dL   HCT 04.5 40.9 - 81.1 %   MCV 84.5 80.0 - 100.0 fL   MCH 28.0 26.0 - 34.0 pg   MCHC 33.2 30.0 - 36.0 g/dL   RDW 91.4 78.2 - 95.6 %   Platelets 373  150 - 400 K/uL   nRBC 0.0 0.0 - 0.2 %    Assessment & Plan:  Present on Admission:  Acute cholecystitis  Benign essential hypertension  Hyperlipidemia    LOS: 0 days   Additional comments:I reviewed the patient's new clinical lab test results.    Acute acalculous cholecystitis - plan lap chole today. Informed consent was obtained after detailed explanation of risks, including bleeding, infection, biloma, hematoma, injury to common bile duct, need for IOC to delineate anatomy, and need for conversion to open procedure. All questions answered to the patient's satisfaction. FEN - NPO except sips/chips DVT - SCDs Dispo -  5C, possibly home today post-op     Diamantina Monks, MD Trauma & General Surgery Please use [email or web address] to contact on call provider  04/26/2023  *Care during the described time interval was provided by me. I have reviewed this patient's available data, including medical history, events of note, physical examination and test results as part of my evaluation.

## 2023-04-26 NOTE — Anesthesia Procedure Notes (Signed)
Procedure Name: Intubation Date/Time: 04/26/2023 8:52 AM  Performed by: Loleta Delicia Berens, CRNAPre-anesthesia Checklist: Patient identified, Patient being monitored, Timeout performed, Emergency Drugs available and Suction available Patient Re-evaluated:Patient Re-evaluated prior to induction Oxygen Delivery Method: Circle system utilized Preoxygenation: Pre-oxygenation with 100% oxygen Induction Type: IV induction Ventilation: Mask ventilation without difficulty Laryngoscope Size: Mac and 4 Grade View: Grade I Tube type: Oral Tube size: 7.5 mm Number of attempts: 1 Airway Equipment and Method: Stylet Placement Confirmation: ETT inserted through vocal cords under direct vision, positive ETCO2 and breath sounds checked- equal and bilateral Secured at: 24 cm Tube secured with: Tape Dental Injury: Teeth and Oropharynx as per pre-operative assessment

## 2023-04-26 NOTE — Discharge Instructions (Signed)
[facility]  LAPAROSCOPIC SURGERY: POST OP INSTRUCTIONS Always review your discharge instruction sheet given to you by the facility where your surgery was performed. IF YOU HAVE DISABILITY OR FAMILY LEAVE FORMS, YOU MUST BRING THEM TO THE OFFICE FOR PROCESSING.   DO NOT GIVE THEM TO YOUR DOCTOR.  PAIN CONTROL  Pain regimen: take over-the-counter tylenol (acetaminophen) 1000mg  every six hours, the prescription ibuprofen (600mg ) every six hours and the robaxin (methocarbamol) 750mg  every six hours. With all three of these, you should be taking something every two hours. Example: tylenol (acetaminophen) at 8am, ibuprofen at 10am, robaxin (methocarbamol) at 12pm, tylenol (acetaminophen) again at 2pm, ibuprofen again at 4pm, robaxin (methocarbamol) at 6pm. You also have a prescription for oxycodone, which should be taken if the tylenol (acetaminophen), ibuprofen, and robaxin (methocarbamol) are not enough to control your pain. You may take the oxycodone as frequently as every four hours as needed, but if you are taking the other medications as above, you should not need the oxycodone this frequently. You have also been given a prescription for colace (docusate) which is a stool softener. Please take this as prescribed because the oxycodone can cause constipation and the colace (docusate) will minimize or prevent constipation. Do not drive while taking or under the influence of the oxycodone as it is a narcotic medication. Use ice packs to help control pain. If you need a refill on your pain medication, please contact your pharmacy.  They will contact our office to request authorization. Prescriptions will not be filled after 5pm or on week-ends.  HOME MEDICATIONS Take your usually prescribed medications unless otherwise directed.  DIET You should follow a light diet the first few days after arrival home.  Be sure to include lots of fluids daily.   CONSTIPATION It is common to  experience some constipation after surgery and if you are taking pain medication.  Increasing fluid intake and taking a stool softener (such as Colace) will usually help or prevent this problem from occurring.  A mild laxative (Miralax, over-the counter) should be taken according to package instructions if there are no bowel movements after 48 hours. If still no bowel movement 24 hours after taking Miralax, you may try magnesium citrate, available over the counter at a local pharmacy.   WOUND/INCISION CARE Most patients will experience some swelling and bruising in the area of the incisions.  Ice packs will help.  Swelling and bruising can take several days to resolve.  May shower beginning 04/27/23.  Do not peel off or scrub skin glue. May allow warm soapy water to run over incision, then rinse and pat dry.  Do not soak in any water (tubs, hot tubs, pools, lakes, oceans) for one week.   ACTIVITIES You may resume regular (light) daily activities beginning the next day--such as daily self-care, walking, climbing stairs--gradually increasing activities as tolerated.  You may have sexual intercourse when it is comfortable.   No lifting greater than 5 pounds for six weeks.  You may drive when you are no longer taking narcotic pain medication, you can comfortably wear a seatbelt, and you can safely maneuver your car and apply brakes.  FOLLOW-UP You should see your doctor in the office for a follow-up appointment approximately 2-3 weeks after your surgery.  You should have been given your post-op/follow-up appointment when your surgery was scheduled.  If you did not receive a post-op/follow-up appointment, make sure that you call for this appointment within a day or two after  you arrive home to ensure a convenient appointment time.  WHEN TO CALL YOUR DOCTOR: Fever over 101.5 Inability to urinate Continued bleeding from incision. Increased pain, redness, or drainage from the incision. Increasing  abdominal pain  The clinic staff is available to answer your questions during regular business hours.  Please don't hesitate to call and ask to speak to one of the nurses for clinical concerns.  If you have a medical emergency, go to the nearest emergency room or call 911.  A surgeon from [facility] is always on call at the hospital. [address], [place], [place]  [address] ? [address] [address], [place], [place]   [address] [phone] ? [phone] ? FAX [phone] Web site: [email or web address]

## 2023-04-27 ENCOUNTER — Encounter (HOSPITAL_COMMUNITY): Payer: Self-pay | Admitting: Surgery

## 2023-04-28 ENCOUNTER — Ambulatory Visit: Payer: No Typology Code available for payment source

## 2023-04-29 LAB — SURGICAL PATHOLOGY

## 2023-04-30 ENCOUNTER — Other Ambulatory Visit: Payer: No Typology Code available for payment source

## 2023-05-05 ENCOUNTER — Ambulatory Visit: Payer: No Typology Code available for payment source | Attending: Neurology | Admitting: Physical Therapy

## 2023-05-05 ENCOUNTER — Encounter: Payer: Self-pay | Admitting: Physical Therapy

## 2023-05-05 DIAGNOSIS — R2689 Other abnormalities of gait and mobility: Secondary | ICD-10-CM | POA: Diagnosis not present

## 2023-05-05 DIAGNOSIS — M6281 Muscle weakness (generalized): Secondary | ICD-10-CM | POA: Insufficient documentation

## 2023-05-05 NOTE — Therapy (Signed)
OUTPATIENT PHYSICAL THERAPY NEURO TREATMENT NOTE   Patient Name: Jim Mcknight MRN: [id number] DOB:Jun 23, 1951, 72 y.o., male Today's Date: 05/05/2023   PCP: Sigmund Hazel, MD REFERRING PROVIDER: Windell Norfolk, MD   END OF SESSION:  PT End of Session - 05/05/23 0845     Visit Number 2    Number of Visits 8    Date for PT Re-Evaluation 06/13/23    Authorization Type [employer] not required; no VL    PT Start Time 0850    PT Stop Time 0928    PT Time Calculation (min) 38 min    Activity Tolerance Patient tolerated treatment well    Behavior During Therapy WFL for tasks assessed/performed              Past Medical History:  Diagnosis Date   Arthritis    Cancer (HCC)    right shoulder Melanoma removed   Elevated PSA    Hypertension    Wears glasses    Past Surgical History:  Procedure Laterality Date   ANAL FISSURE REPAIR  1978   CHOLECYSTECTOMY N/A 04/26/2023   Procedure: LAPAROSCOPIC CHOLECYSTECTOMY;  Surgeon: Diamantina Monks, MD;  Location: [facility];  Service: General;  Laterality: N/A;   EXCISION METACARPAL MASS Left 01/07/2022   Procedure: LEFT THUMB MASS EXCISION;  Surgeon: Betha Loa, MD;  Location: [facility];  Service: Orthopedics;  Laterality: Left;   PROSTATE BIOPSY N/A 08/22/2016   Procedure: BIOPSY TRANSRECTAL ULTRASONIC PROSTATE (TUBP);  Surgeon: Jethro Bolus, MD;  Location[facility];  Service: Urology;  Laterality: N/A;   PROSTATE BIOPSY N/A 01/27/2020   Procedure: BIOPSY TRANSRECTAL ULTRASONIC PROSTATE (TUBP);  Surgeon: Heloise Purpura, MD;  Location: [facility];  Service: Urology;  Laterality: N/A;   TENDON EXPLORATION Left 01/07/2022   Procedure: DEBRIDEMENT INTERPHALANGEAL JOINT;  Surgeon: Betha Loa, MD;  Location: [facility];  Service: Orthopedics;  Laterality: Left;   XI ROBOTIC ASSISTED INGUINAL HERNIA REPAIR WITH MESH Bilateral 10/17/2022   Procedure: XI ROBOTIC ASSISTED  INGUINAL HERNIA REPAIR WITH MESH;  Surgeon: Kinsinger, De Blanch, MD;  Location: [facility];  Service: General;  Laterality: Bilateral;  MESH   Patient Active Problem List   Diagnosis Date Noted   Acute cholecystitis 04/25/2023   Benign essential hypertension 04/25/2023   Hyperlipidemia 04/25/2023    ONSET DATE: 03/31/2023 (MD referral)  REFERRING DIAG: M21.371 (ICD-10-CM) - Right foot drop   THERAPY DIAG:  Muscle weakness (generalized)  Other abnormalities of gait and mobility  Rationale for Evaluation and Treatment: Rehabilitation  SUBJECTIVE:  SUBJECTIVE STATEMENT: Haven't noticed the foot drop as much.  Had to have emergency gallbladder surgery.  Feeling better. Pt accompanied by: self  PERTINENT HISTORY: Per Dr. Teresa Coombs notes:   past medical history of hypertension, hyperlipidemia, history of enlarged prostate who is presenting with painless foot drop for the past year.  Patient reports in past year she started noting low back pain, in the middle of low back, denies any radiation of the pain to right or left leg or buttock.  Suspect a peroneal nerve palsy causing the foot drop but due to his history of back pain and enlarged prostate, would like to rule out L5 radiculopathy. Will also obtain a EMG/NCS.   PAIN:  Are you having pain? Yes: NPRS scale: 0/10 Pain location: center of low back Pain description: sharp Aggravating factors: sit to stand, later in the day Relieving factors: nothing specific  PRECAUTIONS: FallNo lifting over 5 lbs. (From gallbladder surgery)  RED FLAGS: None   WEIGHT BEARING RESTRICTIONS: No  FALLS: Has patient fallen in last 6 months? No  LIVING ENVIRONMENT: Lives with: lives with their spouse Lives in: House/apartment Stairs:  1 step to enter; 1 level Has  following equipment at home: Single point cane  PLOF: Independent and Leisure: enjoys travelling, enjoys hiking  PATIENT GOALS: To get stronger, better control of R foot  OBJECTIVE:    TODAY'S TREATMENT: 05/05/2023 Activity Comments  Reviewed HEP Cues for technique with stretches  Gait with R foot up brace:  200 ft Increased foot clearance  Gait , 150 ft no brace attached Slight foot slap noted  Seated active dorsiflexion, 5 reps Not full ROM  Seated assisted dorsiflexion 10 reps Assisted with strap  Seated, then standing plantarflexion, 10 reps each  Cues for slowed return to neutral for eccentric control  Forward/back walking along counter, 4 reps   Stagger stance forward/back rocking 10 reps Cues for technique  Gait at end of session 170 ft, no device, no foot up brace Pt has slight foot slap, no dragging noted    Access Code: [email or web address] URL: [email or web address] Date: 05/05/2023 Prepared by: [facility] - Outpatient  Rehab - [facility]  Exercises - Standing Gastroc Stretch on Step  - 1-2 x daily - 7 x weekly - 1 sets - 3 reps - 30 sec hold - Seated Gastroc Stretch with Strap  - 1-2 x daily - 7 x weekly - 1 sets - 3 reps - 30 sec hold - Sit to stand in stride stance  - 1 x daily - 7 x weekly - 3 sets - 5 reps - Assisted ankle dorsiflexion  - 1 x daily - 7 x weekly - 2-3 sets - 10 reps - Seated Heel Raise  - 1 x daily - 7 x weekly - 3 sets - 10 reps - Standing Heel Raise with Chair Support  - 1 x daily - 7 x weekly - 3 sets - 10 reps - Backward Walking with Counter Support  - 1-2 x daily - 7 x weekly - 1 sets - 5 reps  PATIENT EDUCATION: Education details: HEP additions Person educated: Patient Education method: Programmer, multimedia, Demonstration, Verbal cues, and Handouts Education comprehension: verbalized understanding, returned demonstration, and needs further education --------------------------------------------------  Note: Objective measures below were  completed at Evaluation unless otherwise noted.  DIAGNOSTIC FINDINGS: TBA-NCV and MRI are scheduled  COGNITION: Overall cognitive status: Within functional limits for tasks assessed   SENSATION: Light touch: WFL  COORDINATION: Heel to shin:  WFL BLE;  alternating toe taps-slightly slowed RLE   MUSCLE TONE: WFL BLE    POSTURE: rounded shoulders, forward head, and posterior pelvic tilt  LOWER EXTREMITY ROM:   Passive R DF 15 degrees  Active  Right Eval Left Eval  Hip flexion    Hip extension    Hip abduction    Hip adduction    Hip internal rotation    Hip external rotation    Knee flexion    Knee extension    Ankle dorsiflexion 2 degrees, compensates with eversion 10  Ankle plantarflexion    Ankle inversion    Ankle eversion     (Blank rows = not tested)  LOWER EXTREMITY MMT:    MMT Right Eval Left Eval  Hip flexion 4 4  Hip extension    Hip abduction    Hip adduction    Hip internal rotation    Hip external rotation    Knee flexion 5 5  Knee extension 5 5  Ankle dorsiflexion 3- 5  Ankle plantarflexion 5 5  Ankle inversion 5 5  Ankle eversion 5 5  (Blank rows = not tested)   TRANSFERS: Assistive device utilized: None  Sit to stand: Modified independence Stand to sit: Modified independence  GAIT: Gait pattern:  slightly decreased foot clearance LLE, step through pattern, and decreased ankle dorsiflexion- Left Distance walked: 100 ft Assistive device utilized: None Level of assistance: Modified independence   FUNCTIONAL TESTS:  5 times sit to stand: 12.25 sec 10 meter walk test: 9.12 sec = 3.6 ft/sec Dynamic Gait Index: NT SLS:  LLE 10 sec, RLE 9.69 sec FGA:  28/30      TODAY'S TREATMENT:                                                                                                                              DATE: 04/22/2023    PATIENT EDUCATION: Education details: PT eval results, POC, HEP initiated; showed patient foot-up brace  that we may try in next session Person educated: Patient Education method: Explanation, Demonstration, and Handouts Education comprehension: verbalized understanding, returned demonstration, and needs further education  HOME EXERCISE PROGRAM: Access Code: [email or web address] URL: [email or web address] Date: 04/22/2023 Prepared by: [facility] - Outpatient  Rehab - [facility]  Exercises - Standing Gastroc Stretch on Step  - 1-2 x daily - 7 x weekly - 1 sets - 3 reps - 30 sec hold - Seated Gastroc Stretch with Strap  - 1-2 x daily - 7 x weekly - 1 sets - 3 reps - 30 sec hold - Sit to stand in stride stance  - 1 x daily - 7 x weekly - 3 sets - 5 reps  GOALS: Goals reviewed with patient? Yes  SHORT TERM GOALS: Target date: 05/16/2023  Pt will be independent with HEP for improved strength, flexibility in R ankle. Baseline: Goal status: INITIAL  2.  Pt will demo improved R ankle AROM to 10 degrees. Baseline:  Goal status: INITIAL   LONG TERM GOALS: Target date: 06/13/2023  Pt will be independent with HEP for improved flexibility, strength, gait. Baseline:  Goal status: INITIAL  2.  Pt will improve R ankle dorsiflexion to at least 3+/5 for improved strength. Baseline:  Goal status: INITIAL  3.  Pt will report improved gait with 50% less foot slap, later in the day/evening, for improved strength and gait. Baseline:  Goal status: INITIAL   ASSESSMENT:  CLINICAL IMPRESSION: Pt returns today, after having to cancel last visit, due to having to have emergency gallbladder surgery.  He reports he was released after surgery and only restrictions are to avoid lifting 5 lbs at this time.  Reviewed HEP from eval and pt does require cues for proper technique of stretching.   Progressed HEP for additional strengthening, but worked on eccentric control due to pt not having full active ankle dorsiflexion ROM.  Trialed foot up brace, with pt noting some improved heelstrike but not  noticing an appreciable difference.  Once foot up brace removed, pt has slight foot slap that returns.  He feels he wants to wait on any bracing at this time.   OBJECTIVE IMPAIRMENTS: Abnormal gait, decreased mobility, difficulty walking, decreased ROM, decreased strength, and impaired flexibility.   ACTIVITY LIMITATIONS: locomotion level  PARTICIPATION LIMITATIONS: community activity and hiking, travelling  PERSONAL FACTORS: 1-2 comorbidities: see above  are also affecting patient's functional outcome.   REHAB POTENTIAL: Good  CLINICAL DECISION MAKING: Stable/uncomplicated  EVALUATION COMPLEXITY: Low  PLAN:  PT FREQUENCY: 1x/week  PT DURATION: 6 weeks including eval visit  PLANNED INTERVENTIONS: 97110-Therapeutic exercises, 97530- Therapeutic activity, [id number]- Neuromuscular re-education, [address]- Self Care, [address]- Manual therapy, [phone]- Gait training, [phone]- Orthotic Fit/training, [address]- Electrical stimulation (unattended), [phone]- Electrical stimulation (manual), Patient/Family education, and Taping  PLAN FOR NEXT SESSION: Review updates to HEP and progress stretching as appropriate; work on R dorsiflexor strengthening, try foot-up brace (pt comes in later in the day next visit)   Sharen Youngren W., PT 05/05/2023, 9:28 AM  [facility] at [facility] [address], [address] [place], [place] [address] Phone # [phone] Fax # [phone]

## 2023-05-07 DIAGNOSIS — N289 Disorder of kidney and ureter, unspecified: Secondary | ICD-10-CM | POA: Diagnosis not present

## 2023-05-07 DIAGNOSIS — I10 Essential (primary) hypertension: Secondary | ICD-10-CM | POA: Diagnosis not present

## 2023-05-07 DIAGNOSIS — Z9049 Acquired absence of other specified parts of digestive tract: Secondary | ICD-10-CM | POA: Diagnosis not present

## 2023-05-07 DIAGNOSIS — D72829 Elevated white blood cell count, unspecified: Secondary | ICD-10-CM | POA: Diagnosis not present

## 2023-05-07 DIAGNOSIS — Z6826 Body mass index (BMI) 26.0-26.9, adult: Secondary | ICD-10-CM | POA: Diagnosis not present

## 2023-05-09 DIAGNOSIS — Z008 Encounter for other general examination: Secondary | ICD-10-CM | POA: Diagnosis not present

## 2023-05-09 DIAGNOSIS — Z6825 Body mass index (BMI) 25.0-25.9, adult: Secondary | ICD-10-CM | POA: Diagnosis not present

## 2023-05-09 DIAGNOSIS — I700 Atherosclerosis of aorta: Secondary | ICD-10-CM | POA: Diagnosis not present

## 2023-05-09 DIAGNOSIS — I10 Essential (primary) hypertension: Secondary | ICD-10-CM | POA: Diagnosis not present

## 2023-05-09 DIAGNOSIS — E663 Overweight: Secondary | ICD-10-CM | POA: Diagnosis not present

## 2023-05-09 DIAGNOSIS — E785 Hyperlipidemia, unspecified: Secondary | ICD-10-CM | POA: Diagnosis not present

## 2023-05-12 ENCOUNTER — Ambulatory Visit: Payer: No Typology Code available for payment source | Admitting: Physical Therapy

## 2023-05-12 ENCOUNTER — Encounter: Payer: Self-pay | Admitting: Physical Therapy

## 2023-05-12 DIAGNOSIS — M6281 Muscle weakness (generalized): Secondary | ICD-10-CM

## 2023-05-12 DIAGNOSIS — R2689 Other abnormalities of gait and mobility: Secondary | ICD-10-CM

## 2023-05-12 NOTE — Therapy (Signed)
OUTPATIENT PHYSICAL THERAPY NEURO TREATMENT NOTE   Patient Name: Jim Mcknight MRN: [id number] DOB:19-Jul-1950, 72 y.o., male Today's Date: 05/12/2023   PCP: Sigmund Hazel, MD REFERRING PROVIDER: Windell Norfolk, MD   END OF SESSION:  PT End of Session - 05/12/23 1453     Visit Number 3    Number of Visits 8    Date for PT Re-Evaluation 06/13/23    Authorization Type [employer]-aut not required; no VL    PT Start Time 1453    PT Stop Time 1531    PT Time Calculation (min) 38 min    Activity Tolerance Patient tolerated treatment well    Behavior During Therapy WFL for tasks assessed/performed               Past Medical History:  Diagnosis Date   Arthritis    Cancer (HCC)    right shoulder Melanoma removed   Elevated PSA    Hypertension    Wears glasses    Past Surgical History:  Procedure Laterality Date   ANAL FISSURE REPAIR  1978   CHOLECYSTECTOMY N/A 04/26/2023   Procedure: LAPAROSCOPIC CHOLECYSTECTOMY;  Surgeon: Diamantina Monks, MD;  Location[facility];  Service: General;  Laterality: N/A;   EXCISION METACARPAL MASS Left 01/07/2022   Procedure: LEFT THUMB MASS EXCISION;  Surgeon: Betha Loa, MD;  Location: [facility];  Service: Orthopedics;  Laterality: Left;   PROSTATE BIOPSY N/A 08/22/2016   Procedure: BIOPSY TRANSRECTAL ULTRASONIC PROSTATE (TUBP);  Surgeon: Jethro Bolus, MD;  Location: [facility];  Service: Urology;  Laterality: N/A;   PROSTATE BIOPSY N/A 01/27/2020   Procedure: BIOPSY TRANSRECTAL ULTRASONIC PROSTATE (TUBP);  Surgeon: Heloise Purpura, MD;  Location: [facility];  Service: Urology;  Laterality: N/A;   TENDON EXPLORATION Left 01/07/2022   Procedure: DEBRIDEMENT INTERPHALANGEAL JOINT;  Surgeon: Betha Loa, MD;  Location: [facility];  Service: Orthopedics;  Laterality: Left;   XI ROBOTIC ASSISTED INGUINAL HERNIA REPAIR WITH MESH Bilateral 10/17/2022   Procedure: XI ROBOTIC ASSISTED  INGUINAL HERNIA REPAIR WITH MESH;  Surgeon: Kinsinger, De Blanch, MD;  Location: [facility];  Service: General;  Laterality: Bilateral;  MESH   Patient Active Problem List   Diagnosis Date Noted   Acute cholecystitis 04/25/2023   Benign essential hypertension 04/25/2023   Hyperlipidemia 04/25/2023    ONSET DATE: 03/31/2023 (MD referral)  REFERRING DIAG: M21.371 (ICD-10-CM) - Right foot drop   THERAPY DIAG:  Muscle weakness (generalized)  Other abnormalities of gait and mobility  Rationale for Evaluation and Treatment: Rehabilitation  SUBJECTIVE:  SUBJECTIVE STATEMENT: Continue to heal slowly and surely from the gallbladder.  Feel like my R ankle is getting stronger. Pt accompanied by: self  PERTINENT HISTORY: Per Dr. Teresa Coombs notes:   past medical history of hypertension, hyperlipidemia, history of enlarged prostate who is presenting with painless foot drop for the past year.  Patient reports in past year she started noting low back pain, in the middle of low back, denies any radiation of the pain to right or left leg or buttock.  Suspect a peroneal nerve palsy causing the foot drop but due to his history of back pain and enlarged prostate, would like to rule out L5 radiculopathy. Will also obtain a EMG/NCS.   PAIN:  Are you having pain? Yes: NPRS scale: 0/10 Pain location: center of low back Pain description: sharp Aggravating factors: sit to stand, later in the day Relieving factors: nothing specific  PRECAUTIONS: FallNo lifting over 5 lbs. (From gallbladder surgery)  RED FLAGS: None   WEIGHT BEARING RESTRICTIONS: No  FALLS: Has patient fallen in last 6 months? No  LIVING ENVIRONMENT: Lives with: lives with their spouse Lives in: House/apartment Stairs:  1 step to enter; 1 level Has  following equipment at home: Single point cane  PLOF: Independent and Leisure: enjoys travelling, enjoys hiking  PATIENT GOALS: To get stronger, better control of R foot  OBJECTIVE:    TODAY'S TREATMENT: 05/12/2023 Activity Comments  Reviewed HEP additions from last visit Good return demo  AROM R ankle dorsiflexion  5 degrees A/ROM, 10 degrees passive  Seated resisted ankle dorsiflexion, green band, 2 x 10 reps Cues for technique  Standing Heel raises/toe raises Uses trunk flexion to compensate for dorsiflexion  Stagger stance forward/back rocking 10 reps For active ankle dorsiflexion  Rockerboard ant/posterior direction Cues to use ankles>hips for controlling rockerboard  Forward heel walking, toe walking 3 reps  Forward/back tandem gait, tandem march, focus on heel strike In parallel bars  Gastroc stretch at 4" step, 5 reps x 15 sec   Standing on incline/decline, RLE as stance:  LLE forward/back step and weightshift x 10 reps   Gait at end of session 170 ft, no device, no foot up brace No foot slap noted     Access Code: [email or web address] URL: [email or web address] Date: 05/12/2023 Prepared by: [facility] - Outpatient  Rehab - [facility]  Exercises - Standing Gastroc Stretch on Step  - 1-2 x daily - 7 x weekly - 1 sets - 3 reps - 30 sec hold - Seated Gastroc Stretch with Strap  - 1-2 x daily - 7 x weekly - 1 sets - 3 reps - 30 sec hold - Sit to stand in stride stance  - 1 x daily - 7 x weekly - 3 sets - 5 reps - Assisted ankle dorsiflexion  - 1 x daily - 7 x weekly - 2-3 sets - 10 reps - Seated Heel Raise  - 1 x daily - 7 x weekly - 3 sets - 10 reps - Standing Heel Raise with Chair Support  - 1 x daily - 7 x weekly - 3 sets - 10 reps - Backward Walking with Counter Support  - 1-2 x daily - 7 x weekly - 1 sets - 5 reps - Seated Ankle Dorsiflexion with Resistance  - 1-2 x daily - 5 x weekly - 3 sets - 10 reps - 3 sec hold - Staggered Stance Forward Backward Weight Shift  with Counter Support  - 1-2 x daily - 5 x  weekly - 3 sets - 10 reps  PATIENT EDUCATION: Education details: HEP additions Person educated: Patient Education method: Explanation, Demonstration, Verbal cues, and Handouts Education comprehension: verbalized understanding, returned demonstration, and needs further education --------------------------------------------------  Note: Objective measures below were completed at Evaluation unless otherwise noted.  DIAGNOSTIC FINDINGS: TBA-NCV and MRI are scheduled  COGNITION: Overall cognitive status: Within functional limits for tasks assessed   SENSATION: Light touch: WFL  COORDINATION: Heel to shin:  WFL BLE; alternating toe taps-slightly slowed RLE   MUSCLE TONE: WFL BLE    POSTURE: rounded shoulders, forward head, and posterior pelvic tilt  LOWER EXTREMITY ROM:   Passive R DF 15 degrees  Active  Right Eval Left Eval  Hip flexion    Hip extension    Hip abduction    Hip adduction    Hip internal rotation    Hip external rotation    Knee flexion    Knee extension    Ankle dorsiflexion 2 degrees, compensates with eversion 10  Ankle plantarflexion    Ankle inversion    Ankle eversion     (Blank rows = not tested)  LOWER EXTREMITY MMT:    MMT Right Eval Left Eval  Hip flexion 4 4  Hip extension    Hip abduction    Hip adduction    Hip internal rotation    Hip external rotation    Knee flexion 5 5  Knee extension 5 5  Ankle dorsiflexion 3- 5  Ankle plantarflexion 5 5  Ankle inversion 5 5  Ankle eversion 5 5  (Blank rows = not tested)   TRANSFERS: Assistive device utilized: None  Sit to stand: Modified independence Stand to sit: Modified independence  GAIT: Gait pattern:  slightly decreased foot clearance LLE, step through pattern, and decreased ankle dorsiflexion- Left Distance walked: 100 ft Assistive device utilized: None Level of assistance: Modified independence   FUNCTIONAL TESTS:  5 times sit  to stand: 12.25 sec 10 meter walk test: 9.12 sec = 3.6 ft/sec Dynamic Gait Index: NT SLS:  LLE 10 sec, RLE 9.69 sec FGA:  28/30      TODAY'S TREATMENT:                                                                                                                              DATE: 04/22/2023    PATIENT EDUCATION: Education details: PT eval results, POC, HEP initiated; showed patient foot-up brace that we may try in next session Person educated: Patient Education method: Explanation, Demonstration, and Handouts Education comprehension: verbalized understanding, returned demonstration, and needs further education  HOME EXERCISE PROGRAM: Access Code: [email or web address] URL: [email or web address] Date: 04/22/2023 Prepared by: [facility] - Outpatient  Rehab - [facility]  Exercises - Standing Gastroc Stretch on Step  - 1-2 x daily - 7 x weekly - 1 sets - 3 reps - 30 sec hold - Seated Gastroc Stretch with Strap  - 1-2 x daily - 7  x weekly - 1 sets - 3 reps - 30 sec hold - Sit to stand in stride stance  - 1 x daily - 7 x weekly - 3 sets - 5 reps  GOALS: Goals reviewed with patient? Yes  SHORT TERM GOALS: Target date: 05/16/2023  Pt will be independent with HEP for improved strength, flexibility in R ankle. Baseline: Goal status: MET, 05/12/2023  2.  Pt will demo improved R ankle AROM to 10 degrees. Baseline: 5 degrees (improved from 2 degrees) Goal status: PARTIALLY MET 05/12/2023   LONG TERM GOALS: Target date: 06/13/2023  Pt will be independent with HEP for improved flexibility, strength, gait. Baseline:  Goal status: INITIAL  2.  Pt will improve R ankle dorsiflexion to at least 3+/5 for improved strength. Baseline:  Goal status: INITIAL  3.  Pt will report improved gait with 50% less foot slap, later in the day/evening, for improved strength and gait. Baseline:  Goal status: INITIAL   ASSESSMENT:  CLINICAL IMPRESSION: Pt presents today and reviewed  HEP, with pt return demo understanding (meeting STG 1).  STG 2 partially met, with some improvement in active R ankle dorsiflexion, just not to goal level.  Updated HEP to work on active and resisted ankle dorsiflexion.  With gait at end of session today, pt does demo good heelstrike and push off, no foot slap noted, despite pt reporting fatigue and ant tib on RLE.  He will continue to benefit from skilled PT towards LTGs for continued strength and flexibility for improved gait control.    OBJECTIVE IMPAIRMENTS: Abnormal gait, decreased mobility, difficulty walking, decreased ROM, decreased strength, and impaired flexibility.   ACTIVITY LIMITATIONS: locomotion level  PARTICIPATION LIMITATIONS: community activity and hiking, travelling  PERSONAL FACTORS: 1-2 comorbidities: see above  are also affecting patient's functional outcome.   REHAB POTENTIAL: Good  CLINICAL DECISION MAKING: Stable/uncomplicated  EVALUATION COMPLEXITY: Low  PLAN:  PT FREQUENCY: 1x/week  PT DURATION: 6 weeks including eval visit  PLANNED INTERVENTIONS: 97110-Therapeutic exercises, 97530- Therapeutic activity, [id number]- Neuromuscular re-education, 97535- Self Care, 09811- Manual therapy, [phone]- Gait training, [phone]- Orthotic Fit/training, 97014- Electrical stimulation (unattended), [phone]- Electrical stimulation (manual), Patient/Family education, and Taping  PLAN FOR NEXT SESSION: Review updates to HEP and progress stretching as appropriate; work on R dorsiflexor strengthening, try foot-up brace again if pt wants to (currently, he notes no difference from using/not using last visit)   Esaw Knippel W., PT 05/12/2023, 4:30 PM  [facility] at [facility] [address], [address] [place], [place] [address] Phone # [phone] Fax # [phone]

## 2023-05-19 ENCOUNTER — Encounter: Payer: Self-pay | Admitting: Physical Therapy

## 2023-05-19 ENCOUNTER — Ambulatory Visit: Payer: No Typology Code available for payment source | Admitting: Physical Therapy

## 2023-05-19 DIAGNOSIS — M6281 Muscle weakness (generalized): Secondary | ICD-10-CM | POA: Diagnosis not present

## 2023-05-19 DIAGNOSIS — R2689 Other abnormalities of gait and mobility: Secondary | ICD-10-CM

## 2023-05-19 NOTE — Patient Instructions (Signed)
Foot up brace ([employer])  Size Large  Can order from [email or web address] or from [employer]

## 2023-05-19 NOTE — Therapy (Signed)
OUTPATIENT PHYSICAL THERAPY NEURO TREATMENT NOTE/DISCHARGE SUMMARY   Patient Name: Jim Mcknight MRN: [id number] DOB:20-Dec-1950, 72 y.o., male Today's Date: 05/19/2023   PCP: Sigmund Hazel, MD REFERRING PROVIDER: Windell Norfolk, MD  PHYSICAL THERAPY DISCHARGE SUMMARY  Visits from Start of Care: 4  Current functional level related to goals / functional outcomes: See below   Remaining deficits: R ankle dorsiflexion strength and flexibility-improving   Education / Equipment: HEP and info on foot-up brace   Patient agrees to discharge. Patient goals were partially met. Patient is being discharged due to being pleased with the current functional level.   END OF SESSION:  PT End of Session - 05/19/23 0837     Visit Number 4    Number of Visits 8    Date for PT Re-Evaluation 06/13/23    Authorization Type [employer]-aut not required; no VL    PT Start Time 0845    PT Stop Time 0908    PT Time Calculation (min) 23 min    Activity Tolerance Patient tolerated treatment well    Behavior During Therapy WFL for tasks assessed/performed                Past Medical History:  Diagnosis Date   Arthritis    Cancer (HCC)    right shoulder Melanoma removed   Elevated PSA    Hypertension    Wears glasses    Past Surgical History:  Procedure Laterality Date   ANAL FISSURE REPAIR  1978   CHOLECYSTECTOMY N/A 04/26/2023   Procedure: LAPAROSCOPIC CHOLECYSTECTOMY;  Surgeon: Diamantina Monks, MD;  Location: [facility];  Service: General;  Laterality: N/A;   EXCISION METACARPAL MASS Left 01/07/2022   Procedure: LEFT THUMB MASS EXCISION;  Surgeon: Betha Loa, MD;  Location: [facility];  Service: Orthopedics;  Laterality: Left;   PROSTATE BIOPSY N/A 08/22/2016   Procedure: BIOPSY TRANSRECTAL ULTRASONIC PROSTATE (TUBP);  Surgeon: Jethro Bolus, MD;  Location: [facility];  Service: Urology;  Laterality: N/A;   PROSTATE BIOPSY N/A  01/27/2020   Procedure: BIOPSY TRANSRECTAL ULTRASONIC PROSTATE (TUBP);  Surgeon: Heloise Purpura, MD;  Location: [facility];  Service: Urology;  Laterality: N/A;   TENDON EXPLORATION Left 01/07/2022   Procedure: DEBRIDEMENT INTERPHALANGEAL JOINT;  Surgeon: Betha Loa, MD;  Location: [facility];  Service: Orthopedics;  Laterality: Left;   XI ROBOTIC ASSISTED INGUINAL HERNIA REPAIR WITH MESH Bilateral 10/17/2022   Procedure: XI ROBOTIC ASSISTED INGUINAL HERNIA REPAIR WITH MESH;  Surgeon: Kinsinger, De Blanch, MD;  Location: [facility];  Service: General;  Laterality: Bilateral;  MESH   Patient Active Problem List   Diagnosis Date Noted   Acute cholecystitis 04/25/2023   Benign essential hypertension 04/25/2023   Hyperlipidemia 04/25/2023    ONSET DATE: 03/31/2023 (MD referral)  REFERRING DIAG: M21.371 (ICD-10-CM) - Right foot drop   THERAPY DIAG:  Muscle weakness (generalized)  Other abnormalities of gait and mobility  Rationale for Evaluation and Treatment: Rehabilitation  SUBJECTIVE:  SUBJECTIVE STATEMENT: Feel like my R foot is not slapping as much. Pt accompanied by: self  PERTINENT HISTORY: Per Dr. Teresa Coombs notes:   past medical history of hypertension, hyperlipidemia, history of enlarged prostate who is presenting with painless foot drop for the past year.  Patient reports in past year she started noting low back pain, in the middle of low back, denies any radiation of the pain to right or left leg or buttock.  Suspect a peroneal nerve palsy causing the foot drop but due to his history of back pain and enlarged prostate, would like to rule out L5 radiculopathy. Will also obtain a EMG/NCS.   PAIN:  Are you having pain? Yes: NPRS scale: 0/10 Pain location: center of low back Pain description:  sharp Aggravating factors: sit to stand, later in the day Relieving factors: nothing specific  PRECAUTIONS: FallNo lifting over 5 lbs. (From gallbladder surgery)  RED FLAGS: None   WEIGHT BEARING RESTRICTIONS: No  FALLS: Has patient fallen in last 6 months? No  LIVING ENVIRONMENT: Lives with: lives with their spouse Lives in: House/apartment Stairs:  1 step to enter; 1 level Has following equipment at home: Single point cane  PLOF: Independent and Leisure: enjoys travelling, enjoys hiking  PATIENT GOALS: To get stronger, better control of R foot  OBJECTIVE:    TODAY'S TREATMENT: 05/19/2023 Activity Comments  Reviewed ankle dorsiflexion resisted and stagger stance rocking Good form  Active dorsiflexion 5 degrees Passive dorsiflexion 15 degrees   Verbal review of HEP Verbalizes understanding  Standing minisquat, 10 reps x 2, 3 sec hold Sit to stand, RLE tucked x 5 reps   Tandem gait 15 ft x 4 reps, then tandem march 15 ft x 4 reps Occasional UE support      Access Code: [email or web address] URL: [email or web address] Date: 05/19/2023 Prepared by: [facility] - Outpatient  Rehab - [facility]  Exercises - Standing Gastroc Stretch on Step  - 1-2 x daily - 7 x weekly - 1 sets - 3 reps - 30 sec hold - Seated Gastroc Stretch with Strap  - 1-2 x daily - 7 x weekly - 1 sets - 3 reps - 30 sec hold - Sit to stand in stride stance  - 1 x daily - 7 x weekly - 3 sets - 5 reps - Assisted ankle dorsiflexion  - 1 x daily - 7 x weekly - 2-3 sets - 10 reps - Seated Heel Raise  - 1 x daily - 7 x weekly - 3 sets - 10 reps - Standing Heel Raise with Chair Support  - 1 x daily - 7 x weekly - 3 sets - 10 reps - Backward Walking with Counter Support  - 1-2 x daily - 7 x weekly - 1 sets - 5 reps - Seated Ankle Dorsiflexion with Resistance  - 1-2 x daily - 5 x weekly - 3 sets - 10 reps - 3 sec hold - Staggered Stance Forward Backward Weight Shift with Counter Support  - 1-2 x daily - 5 x  weekly - 3 sets - 10 reps - Mini Squat  - 1 x daily - 5 x weekly - 3 sets - 10 reps     PATIENT EDUCATION: Education details: HEP addition; info provided on foot-up brace, should he decide to order in the future; POC and progress towards goals Person educated: Patient Education method: Explanation, Demonstration, Verbal cues, and Handouts Education comprehension: verbalized understanding, returned demonstration, and needs further education --------------------------------------------------  Note: Objective measures  below were completed at Evaluation unless otherwise noted.  DIAGNOSTIC FINDINGS: TBA-NCV and MRI are scheduled  COGNITION: Overall cognitive status: Within functional limits for tasks assessed   SENSATION: Light touch: WFL  COORDINATION: Heel to shin:  WFL BLE; alternating toe taps-slightly slowed RLE   MUSCLE TONE: WFL BLE    POSTURE: rounded shoulders, forward head, and posterior pelvic tilt  LOWER EXTREMITY ROM:   Passive R DF 15 degrees  Active  Right Eval Left Eval  Hip flexion    Hip extension    Hip abduction    Hip adduction    Hip internal rotation    Hip external rotation    Knee flexion    Knee extension    Ankle dorsiflexion 2 degrees, compensates with eversion 10  Ankle plantarflexion    Ankle inversion    Ankle eversion     (Blank rows = not tested)  LOWER EXTREMITY MMT:    MMT Right Eval Left Eval  Hip flexion 4 4  Hip extension    Hip abduction    Hip adduction    Hip internal rotation    Hip external rotation    Knee flexion 5 5  Knee extension 5 5  Ankle dorsiflexion 3- 5  Ankle plantarflexion 5 5  Ankle inversion 5 5  Ankle eversion 5 5  (Blank rows = not tested)   TRANSFERS: Assistive device utilized: None  Sit to stand: Modified independence Stand to sit: Modified independence  GAIT: Gait pattern:  slightly decreased foot clearance LLE, step through pattern, and decreased ankle dorsiflexion- Left Distance  walked: 100 ft Assistive device utilized: None Level of assistance: Modified independence   FUNCTIONAL TESTS:  5 times sit to stand: 12.25 sec 10 meter walk test: 9.12 sec = 3.6 ft/sec Dynamic Gait Index: NT SLS:  LLE 10 sec, RLE 9.69 sec FGA:  28/30      TODAY'S TREATMENT:                                                                                                                              DATE: 04/22/2023    PATIENT EDUCATION: Education details: PT eval results, POC, HEP initiated; showed patient foot-up brace that we may try in next session Person educated: Patient Education method: Explanation, Demonstration, and Handouts Education comprehension: verbalized understanding, returned demonstration, and needs further education  HOME EXERCISE PROGRAM: Access Code: [email or web address] URL: [email or web address] Date: 04/22/2023 Prepared by: [facility] - Outpatient  Rehab - [facility]  Exercises - Standing Gastroc Stretch on Step  - 1-2 x daily - 7 x weekly - 1 sets - 3 reps - 30 sec hold - Seated Gastroc Stretch with Strap  - 1-2 x daily - 7 x weekly - 1 sets - 3 reps - 30 sec hold - Sit to stand in stride stance  - 1 x daily - 7 x weekly - 3 sets - 5 reps  GOALS: Goals reviewed with patient?  Yes  SHORT TERM GOALS: Target date: 05/16/2023  Pt will be independent with HEP for improved strength, flexibility in R ankle. Baseline: Goal status: MET, 05/12/2023  2.  Pt will demo improved R ankle AROM to 10 degrees. Baseline: 5 degrees (improved from 2 degrees) Goal status: PARTIALLY MET 05/12/2023   LONG TERM GOALS: Target date: 06/13/2023  Pt will be independent with HEP for improved flexibility, strength, gait. Baseline:  Goal status: MET, 05/19/2023  2.  Pt will improve R ankle dorsiflexion to at least 3+/5 for improved strength. Baseline: 3-/5 05/19/2023 Goal status: NOT MET 05/19/2023  3.  Pt will report improved gait with 50% less foot slap,  later in the day/evening, for improved strength and gait. Baseline: Pt reports some improvement; no episodes of foot drage Goal status: PARTIALLY MET, 05/19/2023   ASSESSMENT:  CLINICAL IMPRESSION: Pt presents today and reports continued improvement with less foot slap.  He reports no episodes of foot drag.  He is able to demo understanding of HEP.  LTG 1 met; LTG 2 not met for strengthening, as pt continues to have 3-/5 strength R dorsiflexion.  LTG 3 partially met.  Pt feels he has exercises he needs to continue at home.  He is agreeable to discharge from PT today.    OBJECTIVE IMPAIRMENTS: Abnormal gait, decreased mobility, difficulty walking, decreased ROM, decreased strength, and impaired flexibility.   ACTIVITY LIMITATIONS: locomotion level  PARTICIPATION LIMITATIONS: community activity and hiking, travelling  PERSONAL FACTORS: 1-2 comorbidities: see above  are also affecting patient's functional outcome.   REHAB POTENTIAL: Good  CLINICAL DECISION MAKING: Stable/uncomplicated  EVALUATION COMPLEXITY: Low  PLAN:  PT FREQUENCY: 1x/week  PT DURATION: 6 weeks including eval visit  PLANNED INTERVENTIONS: 97110-Therapeutic exercises, 97530- Therapeutic activity, [id number]- Neuromuscular re-education, 97535- Self Care, 16109- Manual therapy, [id number]- Gait training, [phone]- Orthotic Fit/training, 97014- Electrical stimulation (unattended), [phone]- Electrical stimulation (manual), Patient/Family education, and Taping  PLAN FOR NEXT SESSION: Discharge PT.   Gean Maidens., PT 05/19/2023, 9:13 AM  [facility] at [facility] [address] [place], [address] [place], [place] [address] Phone # [phone] Fax # [phone]

## 2023-05-20 ENCOUNTER — Ambulatory Visit
Admit: 2023-05-20 | Discharge: 2023-05-20 | Disposition: A | Discharge status: Home / Self Care | Payer: No Typology Code available for payment source | Attending: Neurology

## 2023-05-20 DIAGNOSIS — M21371 Foot drop, right foot: Secondary | ICD-10-CM

## 2023-05-27 ENCOUNTER — Encounter: Payer: No Typology Code available for payment source | Admitting: Neurology

## 2023-06-05 DIAGNOSIS — C44321 Squamous cell carcinoma of skin of nose: Secondary | ICD-10-CM | POA: Diagnosis not present

## 2023-06-05 DIAGNOSIS — Z85820 Personal history of malignant melanoma of skin: Secondary | ICD-10-CM | POA: Diagnosis not present

## 2023-06-05 DIAGNOSIS — Z85828 Personal history of other malignant neoplasm of skin: Secondary | ICD-10-CM | POA: Diagnosis not present

## 2023-07-03 ENCOUNTER — Ambulatory Visit (INDEPENDENT_AMBULATORY_CARE_PROVIDER_SITE_OTHER): Payer: Self-pay | Admitting: Neurology

## 2023-07-03 ENCOUNTER — Ambulatory Visit (INDEPENDENT_AMBULATORY_CARE_PROVIDER_SITE_OTHER): Payer: No Typology Code available for payment source | Admitting: Neurology

## 2023-07-03 DIAGNOSIS — Z0289 Encounter for other administrative examinations: Secondary | ICD-10-CM

## 2023-07-03 DIAGNOSIS — M21371 Foot drop, right foot: Secondary | ICD-10-CM | POA: Diagnosis not present

## 2023-07-03 DIAGNOSIS — G5730 Lesion of lateral popliteal nerve, unspecified lower limb: Secondary | ICD-10-CM

## 2023-07-03 NOTE — Patient Instructions (Addendum)
Common Peroneal Nerve Entrapment    Common peroneal nerve entrapment is a condition that can make it hard to lift a foot. The condition results from pressure on a nerve in the lower leg called the common peroneal nerve. Your common peroneal nerve provides feeling to your outer lower leg and foot. It also supplies the muscles that move your foot and toes upward and outward. What are the causes? This condition may be caused by: Sitting cross-legged, squatting, or kneeling for long periods of time. A hard, direct hit to the outside of the lower leg. Swelling from a knee injury. A break or fracture in one of the lower leg bones. Wearing a boot or cast that ends just below the knee. A growth or cyst near the nerve. What increases the risk? This condition is more likely to develop in people who play: Contact sports, such as football or hockey. Sports where you wear high and stiff boots, such as skiing. What are the signs or symptoms? Symptoms of this condition include: Trouble lifting your foot up (foot drop). Tripping often. Your foot hitting the ground harder than normal as you walk, resulting in a slapping-type sound. Numbness, tingling, or pain in the outside of the knee, outside of the lower leg, and top of the foot. Sensitivity to pressure on the front or outside of the leg. How is this diagnosed? This condition may be diagnosed based on: Your symptoms. Your medical history. A physical exam. Tests, such as: X-rays to check the bones of your knee and leg. MRI to check tendons that attach to the side of your knee. Ultrasound to check for a growth or cyst. Electromyogram (EMG) to check your nerves. During your physical exam, your health care provider will check for numbness in your leg and test the strength of your lower leg muscles. He or she may tap the side of your lower leg to see if that causes tingling. How is this treated? Treatment for this condition may include: Avoiding  activities that make symptoms worse. Using a brace to hold up your foot and toes. Taking anti-inflammatory pain medicines to relieve swelling and lessen pain. Having medicines injected into your ankle joint to lessen pain and swelling. Doing exercises to help you regain or maintain movement (physical therapy). Surgery to take pressure off the nerve. This may be needed if there is no improvement after 2-3 months or if there is a growth pushing on the nerve. Returning gradually to full activity. Follow these instructions at home: If you have a removable brace: Wear the brace as told by your health care provider. Remove it only as told by your health care provider. Check the skin around the brace every day. Tell your health care provider about any concerns. Loosen the brace if your toes tingle, become numb, or turn cold and blue. Keep the brace clean. If the brace is not waterproof: Do not let it get wet. Cover it with a watertight covering when you take a bath or a shower. Activity Ask your health care provider when it is safe to drive if you have a brace on your foot. Do not do any activities that make pain or swelling worse. Do exercises as told by your health care provider. Return to your normal activities as told by your health care provider. Ask your health care provider what activities are safe for you. General instructions Take over-the-counter and prescription medicines only as told by your health care provider. Do not put your full weight  on your knee until your health care provider says you can. Use crutches as directed by your health care provider. Keep all follow-up visits. This is important. How is this prevented? Wear supportive footwear that is appropriate for your athletic activity. Avoid athletic activities that cause ankle pain or swelling. Wear protective padding over your lower legs when playing contact sports. Make sure your boots do not put extra pressure on the area  just below your knees. Do not sit cross-legged for long periods of time. Contact a health care provider if: Your symptoms do not get better in 2-3 months. The weakness or numbness in your leg or foot gets worse. Summary Common peroneal nerve entrapment is a condition that results from pressure on a nerve in the lower leg called the common peroneal nerve. This condition may be caused by a hard hit, swelling, a fracture, or a cyst in the lower leg. Treatment may include rest, a brace, medicines, and physical therapy. Sometimes surgery is needed. Do not do any activities that make pain or swelling worse. This information is not intended to replace advice given to you by your health care provider. Make sure you discuss any questions you have with your health care provider. Document Revised: 02/28/2021 Document Reviewed: 02/28/2021 Elsevier Patient Education  2024 [employer].

## 2023-07-06 NOTE — Progress Notes (Signed)
 Full Name: Jim Mcknight Gender: Male MRN #: [id number] Date of Birth: [birth year]    Visit Date: 07/03/2023 09:35 Age: 73 Years Examining Physician: Dr. Onetha Epp Referring Physician: Dr. Pastor Falling Height: 6 feet 1 inch  History: Evaluate for right foot drop  Summary: Nerve conduction studies were performed on the bilateral lower extremities.  The right peroneal motor nerve (EDB) showed no response.  The right peroneal motor nerve (tib ant) showed delayed distal peak latency (5.9 ms, normal less than 4.7) and reduced amplitude (0.5 mV, normal greater than 3). All remaining nerves (as indicated in the following tables) were within normal limits.  EMG was performed on the right lower extremity: The right tibialis anterior showed spontaneous activity, prolonged motor unit duration, polyphasic motor units and diminished motor unit recruitment.  Patient could not tolerate EMG needle study of the right tibialis posterior.  The right peroneus longus showed spontaneous activity, polyphasic motor units and diminished motor unit recruitment.  The right extensor hallucis longus showed spontaneous activity, prolonged motor unit duration, increased motor unit amplitude, polyphasic motor units and diminished motor unit recruitment.All remaining muscles (as indicated in the following tables) were within normal limits.      Conclusion: The right peroneal sensory conduction is absent. When recording from the EDB muscle, the right peroneal motor conduction is absent. There is low amplitude when recording from the anterior tibialis muscle. EMG needle study showed acute/ongoing denervation and chronic neurogenic changes in right lower extremity muscles innervated by the deep and superficial peroneal nerves. 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 The  most likely localization is at or around the right fibular head however correlation is recommended. There is no evidence of a more widespread peripheral polyneuropathy,sciatic neuropathy or lumbar radiculopathy.    ------------------------------- Onetha Epp, M.D.  [facility] [address] [place], [place] [address] Tel: [phone] Fax: [phone]  Verbal informed consent was obtained from the patient, patient was informed of potential risk of procedure, including bruising, bleeding, hematoma formation, infection, muscle weakness, muscle pain, numbness, among others.        MNC    Nerve / Sites Muscle Latency Ref. Amplitude Ref. Rel Amp Segments Distance Velocity Ref. Area    ms ms mV mV %  cm m/s m/s mVms  R Peroneal - EDB     Ankle EDB NR <=6.5 NR >=2.0 NR Ankle - EDB 9   NR         Pop fossa - Ankle      L Peroneal - EDB     Ankle EDB 6.4 <=6.5 2.6 >=2.0 100 Ankle - EDB 9   9.0     Fib head EDB 12.9  2.4  92.4 Fib head - Ankle 30 46 >=44 8.1     Pop fossa EDB 14.5  2.1  87 Pop fossa - Fib head 8 51 >=44 7.6         Pop fossa - Ankle      R Tibial - AH     Ankle AH 5.3 <=5.8 5.0 >=4.0 100 Ankle - AH 9   12.7     Pop fossa AH 14.9  4.6  91.3 Pop fossa - Ankle 39.6 41 >=41 13.0  L Tibial - AH     Ankle AH 5.1 <=5.8 4.2 >=4.0 100 Ankle -  AH 9   14.2     Pop fossa AH 13.5  2.7  65.3 Pop fossa - Ankle 38 45 >=41 8.1  R Peroneal - Tib Ant     Fib Head Tib Ant 5.9 <=4.7 0.5 >=3.0 100 Fib Head - Tib Ant 10   2.1     Pop fossa Tib Ant 4.6  1.6  349 Pop fossa - Fib Head 8 59 >=44 6.8                 SNC    Nerve / Sites Rec. Site Peak Lat Ref.  Amp Ref. Segments Distance    ms ms V V  cm  R Sural - Ankle (Calf)     Calf Ankle 3.7 <=4.4 5 >=5 Calf - Ankle 14  L Sural - Ankle (Calf)     Calf Ankle 2.7 <=4.4 6 >=5 Calf - Ankle 14  R Superficial peroneal - Ankle     Lat leg Ankle NR <=4.4 NR >=5 Lat leg - Ankle 14  L Superficial peroneal - Ankle      Lat leg Ankle 4.1 <=4.4 5 >=5 Lat leg - Ankle 14             F  Wave    Nerve F Lat Ref.   ms ms  R Tibial - AH 55.9 <=56.0  L Tibial - AH 55.7 <=56.0         EMG Summary Table    Spontaneous MUAP Recruitment  Muscle IA Fib PSW Fasc Other Amp Dur. Poly Pattern  R. Vastus medialis Normal None None None _______ Normal Normal Normal Normal  R. Tibialis anterior Normal None 3+ None _______ Normal Increased 3+ Reduced  R. Tibialis posterior           R. Peroneus longus Normal None 2+ None _______ Normal Normal 2+ Reduced  R. Gastrocnemius (Medial head) Normal None None None _______ Normal Normal Normal Normal  R. Extensor hallucis longus Normal None 3+ None _______ Increased Increased 4+ Single  R. Biceps femoris (long head) Normal None None None _______ Normal Normal Normal Normal  R. Gluteus maximus Normal None None None _______ Normal Normal Normal Normal  R. Gluteus medius Normal None None None _______ Normal Normal Normal Normal  R. Lumbar paraspinals (low) Normal None None None _______ Normal Normal Normal Normal  R. Biceps femoris (short head) Normal None None None _______ Normal Normal Normal Normal

## 2023-07-07 NOTE — Progress Notes (Signed)
 See procedure note.

## 2023-07-07 NOTE — Procedures (Signed)
 Full Name: Jim Mcknight Gender: Male MRN #: [id number] Date of Birth: [birth year]    Visit Date: 07/03/2023 09:35 Age: 73 Years Examining Physician: Dr. Onetha Epp Referring Physician: Dr. Pastor Falling Height: 6 feet 1 inch  History: Evaluate for right foot drop  Summary: Nerve conduction studies were performed on the bilateral lower extremities.  The right peroneal motor nerve (EDB) showed no response.  The right peroneal motor nerve (tib ant) showed delayed distal peak latency (5.9 ms, normal less than 4.7) and reduced amplitude (0.5 mV, normal greater than 3). All remaining nerves (as indicated in the following tables) were within normal limits.  EMG was performed on the right lower extremity: The right tibialis anterior showed spontaneous activity, prolonged motor unit duration, polyphasic motor units and diminished motor unit recruitment.  Patient could not tolerate EMG needle study of the right tibialis posterior.  The right peroneus longus showed spontaneous activity, polyphasic motor units and diminished motor unit recruitment.  The right extensor hallucis longus showed spontaneous activity, prolonged motor unit duration, increased motor unit amplitude, polyphasic motor units and diminished motor unit recruitment.All remaining muscles (as indicated in the following tables) were within normal limits.      Conclusion: The right peroneal sensory conduction is absent. When recording from the EDB muscle, the right peroneal motor conduction is absent. There is low amplitude when recording from the anterior tibialis muscle. EMG needle study showed acute/ongoing denervation and chronic neurogenic changes in right lower extremity muscles innervated by the deep and superficial peroneal nerves. 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 The  most likely localization is at or around the right fibular head however correlation is recommended. There is no evidence of a more widespread peripheral polyneuropathy,sciatic neuropathy or lumbar radiculopathy.    ------------------------------- Onetha Epp, M.D.  [facility] [address] [place], [place] [address] Tel: [phone] Fax: [phone]  Verbal informed consent was obtained from the patient, patient was informed of potential risk of procedure, including bruising, bleeding, hematoma formation, infection, muscle weakness, muscle pain, numbness, among others.        MNC    Nerve / Sites Muscle Latency Ref. Amplitude Ref. Rel Amp Segments Distance Velocity Ref. Area    ms ms mV mV %  cm m/s m/s mVms  R Peroneal - EDB     Ankle EDB NR <=6.5 NR >=2.0 NR Ankle - EDB 9   NR         Pop fossa - Ankle      L Peroneal - EDB     Ankle EDB 6.4 <=6.5 2.6 >=2.0 100 Ankle - EDB 9   9.0     Fib head EDB 12.9  2.4  92.4 Fib head - Ankle 30 46 >=44 8.1     Pop fossa EDB 14.5  2.1  87 Pop fossa - Fib head 8 51 >=44 7.6         Pop fossa - Ankle      R Tibial - AH     Ankle AH 5.3 <=5.8 5.0 >=4.0 100 Ankle - AH 9   12.7     Pop fossa AH 14.9  4.6  91.3 Pop fossa - Ankle 39.6 41 >=41 13.0  L Tibial - AH     Ankle AH 5.1 <=5.8 4.2 >=4.0 100 Ankle -  AH 9   14.2     Pop fossa AH 13.5  2.7  65.3 Pop fossa - Ankle 38 45 >=41 8.1  R Peroneal - Tib Ant     Fib Head Tib Ant 5.9 <=4.7 0.5 >=3.0 100 Fib Head - Tib Ant 10   2.1     Pop fossa Tib Ant 4.6  1.6  349 Pop fossa - Fib Head 8 59 >=44 6.8                 SNC    Nerve / Sites Rec. Site Peak Lat Ref.  Amp Ref. Segments Distance    ms ms V V  cm  R Sural - Ankle (Calf)     Calf Ankle 3.7 <=4.4 5 >=5 Calf - Ankle 14  L Sural - Ankle (Calf)     Calf Ankle 2.7 <=4.4 6 >=5 Calf - Ankle 14  R Superficial peroneal - Ankle     Lat leg Ankle NR <=4.4 NR >=5 Lat leg - Ankle 14  L Superficial peroneal - Ankle      Lat leg Ankle 4.1 <=4.4 5 >=5 Lat leg - Ankle 14             F  Wave    Nerve F Lat Ref.   ms ms  R Tibial - AH 55.9 <=56.0  L Tibial - AH 55.7 <=56.0         EMG Summary Table    Spontaneous MUAP Recruitment  Muscle IA Fib PSW Fasc Other Amp Dur. Poly Pattern  R. Vastus medialis Normal None None None _______ Normal Normal Normal Normal  R. Tibialis anterior Normal None 3+ None _______ Normal Increased 3+ Reduced  R. Tibialis posterior           R. Peroneus longus Normal None 2+ None _______ Normal Normal 2+ Reduced  R. Gastrocnemius (Medial head) Normal None None None _______ Normal Normal Normal Normal  R. Extensor hallucis longus Normal None 3+ None _______ Increased Increased 4+ Single  R. Biceps femoris (long head) Normal None None None _______ Normal Normal Normal Normal  R. Gluteus maximus Normal None None None _______ Normal Normal Normal Normal  R. Gluteus medius Normal None None None _______ Normal Normal Normal Normal  R. Lumbar paraspinals (low) Normal None None None _______ Normal Normal Normal Normal  R. Biceps femoris (short head) Normal None None None _______ Normal Normal Normal Normal

## 2023-08-15 ENCOUNTER — Other Ambulatory Visit: Payer: Self-pay | Admitting: Neurology

## 2023-08-15 DIAGNOSIS — M21371 Foot drop, right foot: Secondary | ICD-10-CM

## 2023-08-15 DIAGNOSIS — G5730 Lesion of lateral popliteal nerve, unspecified lower limb: Secondary | ICD-10-CM

## 2023-08-18 ENCOUNTER — Telehealth: Payer: Self-pay | Admitting: Neurology

## 2023-08-18 NOTE — Telephone Encounter (Signed)
Referral for neurosurgery fax to [facility]. Phone: [phone], Fax: [phone]

## 2023-09-25 DIAGNOSIS — D485 Neoplasm of uncertain behavior of skin: Secondary | ICD-10-CM | POA: Diagnosis not present

## 2023-09-25 DIAGNOSIS — D2272 Melanocytic nevi of left lower limb, including hip: Secondary | ICD-10-CM | POA: Diagnosis not present

## 2023-09-25 DIAGNOSIS — L570 Actinic keratosis: Secondary | ICD-10-CM | POA: Diagnosis not present

## 2023-09-25 DIAGNOSIS — D225 Melanocytic nevi of trunk: Secondary | ICD-10-CM | POA: Diagnosis not present

## 2023-09-25 DIAGNOSIS — L820 Inflamed seborrheic keratosis: Secondary | ICD-10-CM | POA: Diagnosis not present

## 2023-09-25 DIAGNOSIS — L812 Freckles: Secondary | ICD-10-CM | POA: Diagnosis not present

## 2023-09-25 DIAGNOSIS — D2271 Melanocytic nevi of right lower limb, including hip: Secondary | ICD-10-CM | POA: Diagnosis not present

## 2023-09-25 DIAGNOSIS — Z85828 Personal history of other malignant neoplasm of skin: Secondary | ICD-10-CM | POA: Diagnosis not present

## 2023-09-25 DIAGNOSIS — Z85820 Personal history of malignant melanoma of skin: Secondary | ICD-10-CM | POA: Diagnosis not present

## 2023-09-25 DIAGNOSIS — L821 Other seborrheic keratosis: Secondary | ICD-10-CM | POA: Diagnosis not present

## 2023-09-26 DIAGNOSIS — C61 Malignant neoplasm of prostate: Secondary | ICD-10-CM | POA: Diagnosis not present

## 2023-10-03 DIAGNOSIS — C61 Malignant neoplasm of prostate: Secondary | ICD-10-CM | POA: Diagnosis not present

## 2023-12-10 DIAGNOSIS — E7800 Pure hypercholesterolemia, unspecified: Secondary | ICD-10-CM | POA: Diagnosis not present

## 2023-12-10 DIAGNOSIS — Z6826 Body mass index (BMI) 26.0-26.9, adult: Secondary | ICD-10-CM | POA: Diagnosis not present

## 2023-12-10 DIAGNOSIS — Z0000 Encounter for general adult medical examination without abnormal findings: Secondary | ICD-10-CM | POA: Diagnosis not present

## 2023-12-10 DIAGNOSIS — I10 Essential (primary) hypertension: Secondary | ICD-10-CM | POA: Diagnosis not present

## 2023-12-17 DIAGNOSIS — C61 Malignant neoplasm of prostate: Secondary | ICD-10-CM | POA: Diagnosis not present

## 2023-12-17 DIAGNOSIS — N400 Enlarged prostate without lower urinary tract symptoms: Secondary | ICD-10-CM | POA: Diagnosis not present

## 2023-12-17 DIAGNOSIS — I10 Essential (primary) hypertension: Secondary | ICD-10-CM | POA: Diagnosis not present

## 2023-12-17 DIAGNOSIS — Z0000 Encounter for general adult medical examination without abnormal findings: Secondary | ICD-10-CM | POA: Diagnosis not present

## 2023-12-17 DIAGNOSIS — E559 Vitamin D deficiency, unspecified: Secondary | ICD-10-CM | POA: Diagnosis not present

## 2023-12-17 DIAGNOSIS — E7800 Pure hypercholesterolemia, unspecified: Secondary | ICD-10-CM | POA: Diagnosis not present

## 2024-03-29 ENCOUNTER — Ambulatory Visit: Payer: No Typology Code available for payment source | Admitting: Neurology

## 2024-03-31 DIAGNOSIS — D1801 Hemangioma of skin and subcutaneous tissue: Secondary | ICD-10-CM | POA: Diagnosis not present

## 2024-03-31 DIAGNOSIS — L812 Freckles: Secondary | ICD-10-CM | POA: Diagnosis not present

## 2024-03-31 DIAGNOSIS — D2271 Melanocytic nevi of right lower limb, including hip: Secondary | ICD-10-CM | POA: Diagnosis not present

## 2024-03-31 DIAGNOSIS — L821 Other seborrheic keratosis: Secondary | ICD-10-CM | POA: Diagnosis not present

## 2024-03-31 DIAGNOSIS — D225 Melanocytic nevi of trunk: Secondary | ICD-10-CM | POA: Diagnosis not present

## 2024-03-31 DIAGNOSIS — D2261 Melanocytic nevi of right upper limb, including shoulder: Secondary | ICD-10-CM | POA: Diagnosis not present

## 2024-03-31 DIAGNOSIS — Z85828 Personal history of other malignant neoplasm of skin: Secondary | ICD-10-CM | POA: Diagnosis not present

## 2024-03-31 DIAGNOSIS — Z85820 Personal history of malignant melanoma of skin: Secondary | ICD-10-CM | POA: Diagnosis not present

## 2024-03-31 DIAGNOSIS — L570 Actinic keratosis: Secondary | ICD-10-CM | POA: Diagnosis not present

## 2024-04-20 DIAGNOSIS — M5416 Radiculopathy, lumbar region: Secondary | ICD-10-CM | POA: Diagnosis not present

## 2024-04-21 DIAGNOSIS — M47816 Spondylosis without myelopathy or radiculopathy, lumbar region: Secondary | ICD-10-CM | POA: Diagnosis not present
